# Patient Record
Sex: Male | Born: 1990 | Race: White | Hispanic: No | Marital: Married | Smoking: Never smoker
Health system: Southern US, Community
[De-identification: ages and names within clinical notes are randomized; demographics above are authoritative.]

## PROBLEM LIST (undated history)

## (undated) DIAGNOSIS — C6290 Malignant neoplasm of unspecified testis, unspecified whether descended or undescended: Secondary | ICD-10-CM

## (undated) HISTORY — DX: Malignant neoplasm of unspecified testis, unspecified whether descended or undescended: C62.90

---

## 2014-07-22 ENCOUNTER — Ambulatory Visit: Payer: Self-pay | Admitting: Oncology

## 2014-07-22 ENCOUNTER — Other Ambulatory Visit: Payer: Self-pay

## 2014-07-22 ENCOUNTER — Ambulatory Visit: Payer: Self-pay

## 2014-07-29 ENCOUNTER — Telehealth: Payer: Self-pay | Admitting: Oncology

## 2014-07-29 NOTE — Telephone Encounter (Signed)
new patient appt-s/w patient motehr and gave np appt for 07/05 @ 10:30 w/Dr. Alen Blew Referring-[facility]

## 2014-08-13 ENCOUNTER — Telehealth: Payer: Self-pay | Admitting: Oncology

## 2014-08-13 NOTE — Telephone Encounter (Signed)
np appt confirmed for 07/05 @ 10:30 w/Dr. Alen Blew

## 2014-08-17 ENCOUNTER — Encounter: Payer: Self-pay | Admitting: Oncology

## 2014-08-17 ENCOUNTER — Ambulatory Visit (HOSPITAL_BASED_OUTPATIENT_CLINIC_OR_DEPARTMENT_OTHER): Payer: Self-pay | Admitting: Oncology

## 2014-08-17 ENCOUNTER — Telehealth: Payer: Self-pay | Admitting: Oncology

## 2014-08-17 ENCOUNTER — Other Ambulatory Visit: Payer: Self-pay

## 2014-08-17 ENCOUNTER — Encounter (INDEPENDENT_AMBULATORY_CARE_PROVIDER_SITE_OTHER): Payer: Self-pay

## 2014-08-17 ENCOUNTER — Ambulatory Visit: Payer: Self-pay

## 2014-08-17 ENCOUNTER — Telehealth: Payer: Self-pay | Admitting: *Deleted

## 2014-08-17 VITALS — BP 118/75 | HR 63 | Temp 97.7°F | Resp 18 | Ht 68.0 in | Wt 127.9 lb

## 2014-08-17 DIAGNOSIS — Z720 Tobacco use: Secondary | ICD-10-CM

## 2014-08-17 DIAGNOSIS — C6290 Malignant neoplasm of unspecified testis, unspecified whether descended or undescended: Secondary | ICD-10-CM

## 2014-08-17 DIAGNOSIS — R918 Other nonspecific abnormal finding of lung field: Secondary | ICD-10-CM

## 2014-08-17 MED ORDER — PROCHLORPERAZINE MALEATE 10 MG PO TABS: 10.0000 mg | ORAL_TABLET | Freq: Four times a day (QID) | ORAL | Status: DC | PRN

## 2014-08-17 MED ORDER — ONDANSETRON HCL 8 MG PO TABS: 8.0000 mg | ORAL_TABLET | Freq: Three times a day (TID) | ORAL | Status: DC | PRN

## 2014-08-17 NOTE — Telephone Encounter (Signed)
Per staff message and POF I have scheduled appts. Advised scheduler of appts. JMW  

## 2014-08-17 NOTE — Progress Notes (Signed)
Checked in new pt with no insurance.  Pt applied for Medicaid in April.  His caseworker informed him they had to send his case to [facility] so he is awaiting approval.  I informed pt that since he's currently self-pay he will receive a 50 or 55% discount on today's visit and if and when his Medicaid becomes active, billing will submit claims to them for reimbursement.  Pt has my card for any billing questions or concerns.

## 2014-08-17 NOTE — Progress Notes (Signed)
Please see consult note.  

## 2014-08-17 NOTE — Telephone Encounter (Signed)
Gave and pirnted appt sched and avs fo rpt for July Aug and SEpt...lvm for PFT to call back to sched appts....gv pt barium

## 2014-08-17 NOTE — Consult Note (Signed)
Reason for Referral: Testicular cancer.   HPI: Kelly Garza is a pleasant 24 year old gentleman with a history of smoking and possible reactive airway disease. 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 He subsequently developed enlargement of his right testicle while he was at work. He was initially thought to be related to a hernia but subsequent evaluation revealed a possible testicular cancer. He was evaluated by urology at [facility] and underwent a orchiectomy on 04/19/2014. The pathology revealed an 8.0 x 6.5 x 6.1 cm tumor with 65% embryonal component. 25% teratoma and 10% yolk sac. The margins were not involved. The pathological staging was T2 NX. His tumor markers were elevated with alpha-fetoprotein was initially at 2038 and declined to 3.48 on 06/18/2014. His beta-hCG was 942 and declined to a nadir of 18 and most recently was up to 42 on 06/18/2014. Staging workup didn't reveal pulmonary nodules suggesting the possibility of metastatic disease although not confirmed. Patient was evaluated by Dr. Marcello Moores at [facility] medical oncology and recommended systemic chemotherapy. Patient opted to have this done in [place] to be closer to home. Clinically, he is completely recovered at this time and have not have any residual symptoms. He has quit smoking and his breathing is normal.  He does not report any headaches, blurry vision, syncope or seizures. He does not report any fevers, chills, sweats or weight loss. He does not report any chest pain, palpitation, orthopnea or PND. He does not report any cough, wheezing, hemoptysis, shortness of breath, dyspnea on exertion or difficulty breathing. He does not report any nausea, vomiting, abdominal pain, constipation, diarrhea, hematochezia or change in his bowel  habits. He does not report any frequency, urgency, hesitancy or hematuria. He does not report any skeletal complaints including arthralgias myalgias. He does not report any total cold intolerance. Does not report any mood disorder clearing anxiety or depression. Remaining review of systems unremarkable.   Past Medical History  Diagnosis Date  . Testis cancer   :  No past surgical history on file.:   Current outpatient prescriptions:  .  albuterol (PROVENTIL HFA;VENTOLIN HFA) 108 (90 BASE) MCG/ACT inhaler, Inhale into the lungs every 6 (six) hours as needed for wheezing or shortness of breath., Disp: , Rfl:  .  ibuprofen (ADVIL,MOTRIN) 200 MG tablet, Take 200 mg by mouth every 6 (six) hours as needed., Disp: , Rfl:  .  nicotine (NICODERM CQ - DOSED IN MG/24 HR) 7 mg/24hr patch, Place 7 mg onto the skin daily., Disp: , Rfl:  .  testosterone enanthate (DELATESTRYL) 200 MG/ML injection, Inject 200 mg into the muscle every 14 (fourteen) days. For IM use only, Disp: , Rfl:  .  ondansetron (ZOFRAN) 8 MG tablet, Take 1 tablet (8 mg total) by mouth every 8 (eight) hours as needed for nausea or vomiting., Disp: 20 tablet, Rfl: 0 .  prochlorperazine (COMPAZINE) 10 MG tablet, Take 1 tablet (10 mg total) by mouth every 6 (six) hours as needed for nausea or vomiting., Disp: 30 tablet, Rfl: 0:  No Known Allergies:  No family history on file.:  History   Social History  . Marital Status: Single    Spouse Name: N/A  . Number of Children: N/A  . Years of Education: N/A   Occupational History  . Not on file.   Social  History Main Topics  . Smoking status: Not on file  . Smokeless tobacco: Not on file  . Alcohol Use: Not on file  . Drug Use: Not on file  . Sexual Activity: Not on file   Other Topics Concern  . Not on file   Social History Narrative  . No narrative on file  :  Pertinent items are noted in HPI.  Exam: ECOG 0 Blood pressure 118/75, pulse 63, temperature 97.7 F (36.5 C),  temperature source Oral, resp. rate 18, height 5\' 8"  (1.727 m), weight 127 lb 14.4 oz (58.015 kg), SpO2 100 %. General appearance: alert and cooperative Head: Normocephalic, without obvious abnormality Nose: Nares normal. Septum midline. Mucosa normal. No drainage or sinus tenderness. Throat: lips, mucosa, and tongue normal; teeth and gums normal Neck: no adenopathy Back: negative Resp: clear to auscultation bilaterally Chest wall: no tenderness Cardio: regular rate and rhythm, S1, S2 normal, no murmur, click, rub or gallop GI: soft, non-tender; bowel sounds normal; no masses,  no organomegaly Extremities: extremities normal, atraumatic, no cyanosis or edema Pulses: 2+ and symmetric Skin: Skin color, texture, turgor normal. No rashes or lesions Lymph nodes: Cervical, supraclavicular, and axillary nodes normal. Neurologic: Grossly normal   His laboratory testing obtained at [facility] were reviewed personally today. CBC showed a hemoglobin of 15.1. Chemistries showed normal liver function test and normal kidney function tests. He had a normal LDH. Tumor markers noted in the history of present illness.   Assessment and Plan:  24 year old gentleman with the following issues:  1. 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 The pathology showed a focal LVI . His tumor markers were elevated with alpha-fetoprotein of 2038 and a beta-hCG of 942. Alpha-fetoprotein normalized postoperatively and his beta-hCG after a nadir of 18 was climbing up to 26. Imaging studies suggested pulmonary nodules that could be suspicious for malignancy. His staging including stage IS versus stage IIIa.  The natural course of this disease was discussed with the patient and his family today. I do agree that he will require  systemic chemotherapy regardless of the nature of his pulmonary nodules. Given the rise in his tumor marker and his high risk disease is very reasonable to assume that he has metastatic disease.  The logistics of administration of BEP chemotherapy was discussed. Complications include nausea, vomiting, myelosuppression, neutropenia, neutropenic sepsis, infusion related complications, neuropathy, renal toxicity were reviewed. Rare complications such as secondary malignancies very important and were reviewed today especially in such a young patient.  The, case associated with bleomycin were specially reviewed. These would include pulmonary toxicity but could be severe and rarely can result in death. I am inclined to limit for bleomycin if he has any abnormalities in his pulmonary function test. He has quit smoking at this time his pulmonary status is stable.  I plan to proceed with 3 cycles of BEP all 4 cycles of BEP if there is any contraindication from a pulmonary standpoint for bleomycin. Chemotherapy education class will be set up for him as well. I will restage him with CT scan and repeat tumor markers before the start of chemotherapy.  The goal of systemic chemotherapy is curative at this time.  2. Pulmonary toxicity prophylaxis: I will obtain pulmonary function tests before the first cycle and before each cycle and  will forward. I continue to instruct him to abstain from smoking and report any respiratory symptoms he develops at any time.  3. Neutropenia prophylaxis: He might require growth factor support depending on his blood counts moving forward.  4. IV access: Risks and benefits of Port-A-Cath insertion were discussed today. These complications would include thrombosis, bleeding or infection. He elected to proceed with his peripheral veins for the time being but that could be an option in the future.  5. Antiemetics: Prescription for Zofran and Compazine was given to the patient today and we  can adjust his antiemetics regimen if needed to further.  6. Fertility issues: He has underwent sperm banking prior to his orchiectomy in March 2016.  7. Follow-up: Will be upon start of his systemic chemotherapy tentatively around 08/30/2014.  All questions were answered today to his satisfaction.

## 2014-08-19 ENCOUNTER — Other Ambulatory Visit (HOSPITAL_COMMUNITY): Payer: Self-pay | Admitting: Respiratory Therapy

## 2014-08-24 ENCOUNTER — Encounter: Payer: Self-pay | Admitting: *Deleted

## 2014-08-24 ENCOUNTER — Other Ambulatory Visit (HOSPITAL_BASED_OUTPATIENT_CLINIC_OR_DEPARTMENT_OTHER): Payer: Self-pay

## 2014-08-24 ENCOUNTER — Other Ambulatory Visit: Payer: Self-pay

## 2014-08-24 DIAGNOSIS — C6290 Malignant neoplasm of unspecified testis, unspecified whether descended or undescended: Secondary | ICD-10-CM

## 2014-08-24 LAB — CBC WITH DIFFERENTIAL/PLATELET
BASO%: 1.3 % (ref 0.0–2.0)
Basophils Absolute: 0.1 10*3/uL (ref 0.0–0.1)
EOS%: 3.9 % (ref 0.0–7.0)
Eosinophils Absolute: 0.2 10*3/uL (ref 0.0–0.5)
HCT: 48.9 % (ref 38.4–49.9)
HEMOGLOBIN: 16.5 g/dL (ref 13.0–17.1)
LYMPH%: 20.3 % (ref 14.0–49.0)
MCH: 31.6 pg (ref 27.2–33.4)
MCHC: 33.7 g/dL (ref 32.0–36.0)
MCV: 93.8 fL (ref 79.3–98.0)
MONO#: 0.4 10*3/uL (ref 0.1–0.9)
MONO%: 7.4 % (ref 0.0–14.0)
NEUT%: 67.1 % (ref 39.0–75.0)
NEUTROS ABS: 3.7 10*3/uL (ref 1.5–6.5)
Platelets: 275 10*3/uL (ref 140–400)
RBC: 5.21 10*6/uL (ref 4.20–5.82)
RDW: 13.7 % (ref 11.0–14.6)
WBC: 5.5 10*3/uL (ref 4.0–10.3)
lymph#: 1.1 10*3/uL (ref 0.9–3.3)

## 2014-08-24 LAB — COMPREHENSIVE METABOLIC PANEL (CC13)
ALBUMIN: 4.3 g/dL (ref 3.5–5.0)
ALK PHOS: 80 U/L (ref 40–150)
ALT: 32 U/L (ref 0–55)
ANION GAP: 7 meq/L (ref 3–11)
AST: 28 U/L (ref 5–34)
BILIRUBIN TOTAL: 0.31 mg/dL (ref 0.20–1.20)
BUN: 10.1 mg/dL (ref 7.0–26.0)
CALCIUM: 9.7 mg/dL (ref 8.4–10.4)
CHLORIDE: 106 meq/L (ref 98–109)
CO2: 28 meq/L (ref 22–29)
CREATININE: 1 mg/dL (ref 0.7–1.3)
EGFR: >90 mL/min/{1.73_m2} (ref >90)
Glucose: 98 mg/dl (ref 70–140)
Potassium: 5 mEq/L (ref 3.5–5.1)
SODIUM: 141 meq/L (ref 136–145)
TOTAL PROTEIN: 7.6 g/dL (ref 6.4–8.3)

## 2014-08-24 LAB — LACTATE DEHYDROGENASE (CC13): LDH: 179 U/L (ref 125–245)

## 2014-08-25 ENCOUNTER — Ambulatory Visit (HOSPITAL_COMMUNITY)
Admission: RE | Admit: 2014-08-25 | Discharge: 2014-08-25 | Disposition: A | Discharge status: Home / Self Care | Payer: Self-pay | Source: Ambulatory Visit | Attending: Oncology | Admitting: Oncology

## 2014-08-25 ENCOUNTER — Encounter (HOSPITAL_COMMUNITY): Payer: Self-pay

## 2014-08-25 ENCOUNTER — Encounter: Payer: Self-pay | Admitting: *Deleted

## 2014-08-25 DIAGNOSIS — Z9079 Acquired absence of other genital organ(s): Secondary | ICD-10-CM | POA: Insufficient documentation

## 2014-08-25 DIAGNOSIS — R911 Solitary pulmonary nodule: Secondary | ICD-10-CM | POA: Insufficient documentation

## 2014-08-25 DIAGNOSIS — C6290 Malignant neoplasm of unspecified testis, unspecified whether descended or undescended: Secondary | ICD-10-CM | POA: Insufficient documentation

## 2014-08-25 DIAGNOSIS — J439 Emphysema, unspecified: Secondary | ICD-10-CM | POA: Insufficient documentation

## 2014-08-25 DIAGNOSIS — R599 Enlarged lymph nodes, unspecified: Secondary | ICD-10-CM | POA: Insufficient documentation

## 2014-08-25 DIAGNOSIS — Z08 Encounter for follow-up examination after completed treatment for malignant neoplasm: Secondary | ICD-10-CM | POA: Insufficient documentation

## 2014-08-25 MED ORDER — IOHEXOL 300 MG/ML  SOLN
100.0000 mL | Freq: Once | INTRAMUSCULAR | Status: AC | PRN
  Administered 2014-08-25: 100 mL via INTRAVENOUS

## 2014-08-25 NOTE — Progress Notes (Signed)
Spoke with tiffany in medical records, she called [facility] and they have not done a PFT on patient since 1998. Will notify dr Alen Blew.

## 2014-08-25 NOTE — Progress Notes (Signed)
Discussed with Dr. Hazeline Junker nurse about if a PFT had been done on patient.  Maybe using one from [facility]? Patient had stated on 7/12 that thought using one done at [facility].  Dixie will follow up.

## 2014-08-26 ENCOUNTER — Other Ambulatory Visit (HOSPITAL_COMMUNITY): Payer: Self-pay | Admitting: Respiratory Therapy

## 2014-08-26 ENCOUNTER — Other Ambulatory Visit: Payer: Self-pay | Admitting: *Deleted

## 2014-08-26 ENCOUNTER — Other Ambulatory Visit: Payer: Self-pay | Admitting: Hematology and Oncology

## 2014-08-26 ENCOUNTER — Telehealth: Payer: Self-pay | Admitting: *Deleted

## 2014-08-26 ENCOUNTER — Encounter: Payer: Self-pay | Admitting: *Deleted

## 2014-08-26 DIAGNOSIS — C6290 Malignant neoplasm of unspecified testis, unspecified whether descended or undescended: Secondary | ICD-10-CM

## 2014-08-26 LAB — PULMONARY FUNCTION TEST
DL/VA % PRED: 105 %
DL/VA: 4.84 ml/min/mmHg/L
DLCO cor % pred: 91 %
DLCO cor: 27.24 ml/min/mmHg
DLCO unc % pred: 96 %
DLCO unc: 28.59 ml/min/mmHg
FEF 25-75 Post: 3.41 L/sec
FEF 25-75 Pre: 1.97 L/sec
FEF2575-%Change-Post: 73 %
FEF2575-%PRED-POST: 73 %
FEF2575-%PRED-PRE: 42 %
FEV1-%Change-Post: 17 %
FEV1-%PRED-POST: 81 %
FEV1-%PRED-PRE: 68 %
FEV1-Post: 3.55 L
FEV1-Pre: 3.01 L
FEV1FVC-%Change-Post: 12 %
FEV1FVC-%Pred-Pre: 82 %
FEV6-%CHANGE-POST: 4 %
FEV6-%PRED-POST: 87 %
FEV6-%Pred-Pre: 83 %
FEV6-PRE: 4.37 L
FEV6-Post: 4.59 L
FEV6FVC-%Change-Post: 0 %
FEV6FVC-%PRED-POST: 100 %
FEV6FVC-%Pred-Pre: 99 %
FVC-%CHANGE-POST: 4 %
FVC-%PRED-PRE: 83 %
FVC-%Pred-Post: 87 %
FVC-POST: 4.59 L
FVC-PRE: 4.39 L
POST FEV1/FVC RATIO: 77 %
PRE FEV6/FVC RATIO: 100 %
Post FEV6/FVC ratio: 100 %
Pre FEV1/FVC ratio: 68 %
RV % pred: 132 %
RV: 1.81 L
TLC % PRED: 94 %
TLC: 6.13 L

## 2014-08-26 NOTE — Telephone Encounter (Signed)
Spoke with patient, gave appt date and time for tomorrow @ [facility]. 10:45 am. No smoking, no caffeine, and no breathing treatments before appt. Patient verbalized understanding and gave # of PFT associate tara [phone], should he have any questions.

## 2014-08-26 NOTE — Progress Notes (Signed)
Called patient, explained the need for a PFT and that schedulers would be calling him to have this done tomorrow (08/27/14). Patient verbalized understanding. Instructed patient to call me back this afternoon, if he has not received a call from scheduling. Note to dr Hazeline Junker desk

## 2014-08-27 ENCOUNTER — Ambulatory Visit (HOSPITAL_COMMUNITY)
Admission: RE | Admit: 2014-08-27 | Discharge: 2014-08-27 | Disposition: A | Discharge status: Home / Self Care | Payer: Self-pay | Source: Ambulatory Visit | Attending: Hematology and Oncology | Admitting: Hematology and Oncology

## 2014-08-27 ENCOUNTER — Encounter (HOSPITAL_COMMUNITY): Payer: Self-pay

## 2014-08-27 ENCOUNTER — Inpatient Hospital Stay (HOSPITAL_COMMUNITY): Admission: RE | Admit: 2014-08-27 | Payer: Self-pay | Source: Ambulatory Visit

## 2014-08-27 DIAGNOSIS — C6290 Malignant neoplasm of unspecified testis, unspecified whether descended or undescended: Secondary | ICD-10-CM | POA: Insufficient documentation

## 2014-08-27 LAB — AFP TUMOR MARKER: AFP TUMOR MARKER: 2.8 ng/mL (ref <6.1)

## 2014-08-27 LAB — BETA HCG QUANT (REF LAB): BETA HCG, TUMOR MARKER: 412.4 m[IU]/mL — AB (ref <5.0)

## 2014-08-27 MED ORDER — ALBUTEROL SULFATE (2.5 MG/3ML) 0.083% IN NEBU
2.5000 mg | INHALATION_SOLUTION | Freq: Once | RESPIRATORY_TRACT | Status: AC
  Administered 2014-08-27: 2.5 mg via RESPIRATORY_TRACT

## 2014-08-30 ENCOUNTER — Ambulatory Visit (HOSPITAL_BASED_OUTPATIENT_CLINIC_OR_DEPARTMENT_OTHER): Payer: Self-pay

## 2014-08-30 ENCOUNTER — Other Ambulatory Visit (HOSPITAL_BASED_OUTPATIENT_CLINIC_OR_DEPARTMENT_OTHER): Payer: Self-pay

## 2014-08-30 VITALS — BP 123/75 | HR 86 | Temp 98.0°F | Resp 18

## 2014-08-30 DIAGNOSIS — C6290 Malignant neoplasm of unspecified testis, unspecified whether descended or undescended: Secondary | ICD-10-CM

## 2014-08-30 DIAGNOSIS — Z5111 Encounter for antineoplastic chemotherapy: Secondary | ICD-10-CM

## 2014-08-30 LAB — CBC WITH DIFFERENTIAL/PLATELET
BASO%: 2.2 % — ABNORMAL HIGH (ref 0.0–2.0)
Basophils Absolute: 0.1 10*3/uL (ref 0.0–0.1)
EOS ABS: 0.2 10*3/uL (ref 0.0–0.5)
EOS%: 3.4 % (ref 0.0–7.0)
HCT: 45.6 % (ref 38.4–49.9)
HEMOGLOBIN: 15.4 g/dL (ref 13.0–17.1)
LYMPH%: 22.1 % (ref 14.0–49.0)
MCH: 31.5 pg (ref 27.2–33.4)
MCHC: 33.8 g/dL (ref 32.0–36.0)
MCV: 93 fL (ref 79.3–98.0)
MONO#: 0.5 10*3/uL (ref 0.1–0.9)
MONO%: 8.2 % (ref 0.0–14.0)
NEUT#: 4.1 10*3/uL (ref 1.5–6.5)
NEUT%: 64.1 % (ref 39.0–75.0)
PLATELETS: 266 10*3/uL (ref 140–400)
RBC: 4.9 10*6/uL (ref 4.20–5.82)
RDW: 13.4 % (ref 11.0–14.6)
WBC: 6.4 10*3/uL (ref 4.0–10.3)
lymph#: 1.4 10*3/uL (ref 0.9–3.3)

## 2014-08-30 LAB — COMPREHENSIVE METABOLIC PANEL (CC13)
ALBUMIN: 3.9 g/dL (ref 3.5–5.0)
ALT: 55 U/L (ref 0–55)
ANION GAP: 7 meq/L (ref 3–11)
AST: 33 U/L (ref 5–34)
Alkaline Phosphatase: 75 U/L (ref 40–150)
BUN: 13.6 mg/dL (ref 7.0–26.0)
CHLORIDE: 108 meq/L (ref 98–109)
CO2: 24 meq/L (ref 22–29)
CREATININE: 0.8 mg/dL (ref 0.7–1.3)
Calcium: 9.2 mg/dL (ref 8.4–10.4)
Glucose: 112 mg/dl (ref 70–140)
Potassium: 3.9 mEq/L (ref 3.5–5.1)
SODIUM: 139 meq/L (ref 136–145)
Total Bilirubin: 0.22 mg/dL (ref 0.20–1.20)
Total Protein: 7 g/dL (ref 6.4–8.3)

## 2014-08-30 MED ORDER — SODIUM CHLORIDE 0.9 % IV SOLN
20.0000 mg/m2 | Freq: Once | INTRAVENOUS | Status: AC
  Administered 2014-08-30: 33 mg via INTRAVENOUS
  Filled 2014-08-30: qty 33

## 2014-08-30 MED ORDER — SODIUM CHLORIDE 0.9 % IV SOLN
Freq: Once | INTRAVENOUS | Status: AC
  Administered 2014-08-30: 12:00:00 via INTRAVENOUS
  Filled 2014-08-30: qty 5

## 2014-08-30 MED ORDER — SODIUM CHLORIDE 0.9 % IV SOLN
100.0000 mg/m2 | Freq: Once | INTRAVENOUS | Status: AC
  Administered 2014-08-30: 170 mg via INTRAVENOUS
  Filled 2014-08-30: qty 8.5

## 2014-08-30 MED ORDER — SODIUM CHLORIDE 0.9 % IV SOLN
Freq: Once | INTRAVENOUS | Status: AC
  Administered 2014-08-30: 10:00:00 via INTRAVENOUS

## 2014-08-30 MED ORDER — PALONOSETRON HCL INJECTION 0.25 MG/5ML
INTRAVENOUS | Status: AC
  Filled 2014-08-30: qty 5

## 2014-08-30 MED ORDER — POTASSIUM CHLORIDE 2 MEQ/ML IV SOLN
Freq: Once | INTRAVENOUS | Status: AC
  Administered 2014-08-30: 10:00:00 via INTRAVENOUS
  Filled 2014-08-30: qty 10

## 2014-08-30 MED ORDER — PALONOSETRON HCL INJECTION 0.25 MG/5ML
0.2500 mg | Freq: Once | INTRAVENOUS | Status: AC
  Administered 2014-08-30: 0.25 mg via INTRAVENOUS

## 2014-08-30 NOTE — Progress Notes (Signed)
Total output 1600cc

## 2014-08-30 NOTE — Patient Instructions (Addendum)
[facility] Discharge Instructions for Patients Receiving Chemotherapy  Today you received the following chemotherapy agents Cisplatin   To help prevent nausea and vomiting after your treatment, we encourage you to take your nausea medication as directed   If you develop nausea and vomiting that is not controlled by your nausea medication, call the clinic.   BELOW ARE SYMPTOMS THAT SHOULD BE REPORTED IMMEDIATELY:  *FEVER GREATER THAN 100.5 F  *CHILLS WITH OR WITHOUT FEVER  NAUSEA AND VOMITING THAT IS NOT CONTROLLED WITH YOUR NAUSEA MEDICATION  *UNUSUAL SHORTNESS OF BREATH  *UNUSUAL BRUISING OR BLEEDING  TENDERNESS IN MOUTH AND THROAT WITH OR WITHOUT PRESENCE OF ULCERS  *URINARY PROBLEMS  *BOWEL PROBLEMS  UNUSUAL RASH Items with * indicate a potential emergency and should be followed up as soon as possible.  Feel free to call the clinic you have any questions or concerns. The clinic phone number is [phone].  Please show the [place] at check-in to the Emergency Department and triage nurse.   Etoposide, VP-16 injection What is this medicine? ETOPOSIDE, VP-16 (e toe POE side) is a chemotherapy drug. It is used to treat testicular cancer, lung cancer, and other cancers. This medicine may be used for other purposes; ask your health care provider or pharmacist if you have questions. COMMON BRAND NAME(S): Etopophos, Toposar, VePesid What should I tell my health care provider before I take this medicine? They need to know if you have any of these conditions: -infection -kidney disease -low blood counts, like low white cell, platelet, or red cell counts -an unusual or allergic reaction to etoposide, other chemotherapeutic agents, other medicines, foods, dyes, or preservatives -pregnant or trying to get pregnant -breast-feeding How should I use this medicine? This medicine is for infusion into a vein. It is administered in a hospital or clinic by  a specially trained health care professional. Talk to your pediatrician regarding the use of this medicine in children. Special care may be needed. Overdosage: If you think you have taken too much of this medicine contact a poison control center or emergency room at once. NOTE: This medicine is only for you. Do not share this medicine with others. What if I miss a dose? It is important not to miss your dose. Call your doctor or health care professional if you are unable to keep an appointment. What may interact with this medicine? -cyclosporine -medicines to increase blood counts like filgrastim, pegfilgrastim, sargramostim -vaccines This list may not describe all possible interactions. Give your health care provider a list of all the medicines, herbs, non-prescription drugs, or dietary supplements you use. Also tell them if you smoke, drink alcohol, or use illegal drugs. Some items may interact with your medicine. What should I watch for while using this medicine? Visit your doctor for checks on your progress. This drug may make you feel generally unwell. This is not uncommon, as chemotherapy can affect healthy cells as well as cancer cells. Report any side effects. Continue your course of treatment even though you feel ill unless your doctor tells you to stop. In some cases, you may be given additional medicines to help with side effects. Follow all directions for their use. Call your doctor or health care professional for advice if you get a fever, chills or sore throat, or other symptoms of a cold or flu. Do not treat yourself. This drug decreases your body's ability to fight infections. Try to avoid being around people who are sick. This medicine  may increase your risk to bruise or bleed. Call your doctor or health care professional if you notice any unusual bleeding. Be careful brushing and flossing your teeth or using a toothpick because you may get an infection or bleed more easily. If you  have any dental work done, tell your dentist you are receiving this medicine. Avoid taking products that contain aspirin, acetaminophen, ibuprofen, naproxen, or ketoprofen unless instructed by your doctor. These medicines may hide a fever. Do not become pregnant while taking this medicine. Women should inform their doctor if they wish to become pregnant or think they might be pregnant. There is a potential for serious side effects to an unborn child. Talk to your health care professional or pharmacist for more information. Do not breast-feed an infant while taking this medicine. What side effects may I notice from receiving this medicine? Side effects that you should report to your doctor or health care professional as soon as possible: -allergic reactions like skin rash, itching or hives, swelling of the face, lips, or tongue -low blood counts - this medicine may decrease the number of white blood cells, red blood cells and platelets. You may be at increased risk for infections and bleeding. -signs of infection - fever or chills, cough, sore throat, pain or difficulty passing urine -signs of decreased platelets or bleeding - bruising, pinpoint red spots on the skin, black, tarry stools, blood in the urine -signs of decreased red blood cells - unusually weak or tired, fainting spells, lightheadedness -breathing problems -changes in vision -mouth or throat sores or ulcers -pain, redness, swelling or irritation at the injection site -pain, tingling, numbness in the hands or feet -redness, blistering, peeling or loosening of the skin, including inside the mouth -seizures -vomiting Side effects that usually do not require medical attention (report to your doctor or health care professional if they continue or are bothersome): -diarrhea -hair loss -loss of appetite -nausea -stomach pain This list may not describe all possible side effects. Call your doctor for medical advice about side effects.  You may report side effects to FDA at 1-800-FDA-1088. Where should I keep my medicine? This drug is given in a hospital or clinic and will not be stored at home. NOTE: This sheet is a summary. It may not cover all possible information. If you have questions about this medicine, talk to your doctor, pharmacist, or health care provider.  2015, Elsevier/Gold Standard. (2007-06-02 17:24:12)   Cisplatin injection What is this medicine? CISPLATIN (SIS pla tin) is a chemotherapy drug. It targets fast dividing cells, like cancer cells, and causes these cells to die. This medicine is used to treat many types of cancer like bladder, ovarian, and testicular cancers. This medicine may be used for other purposes; ask your health care provider or pharmacist if you have questions. COMMON BRAND NAME(S): Platinol, Platinol -AQ What should I tell my health care provider before I take this medicine? They need to know if you have any of these conditions: -blood disorders -hearing problems -kidney disease -recent or ongoing radiation therapy -an unusual or allergic reaction to cisplatin, carboplatin, other chemotherapy, other medicines, foods, dyes, or preservatives -pregnant or trying to get pregnant -breast-feeding How should I use this medicine? This drug is given as an infusion into a vein. It is administered in a hospital or clinic by a specially trained health care professional. Talk to your pediatrician regarding the use of this medicine in children. Special care may be needed. Overdosage: If you think you have  taken too much of this medicine contact a poison control center or emergency room at once. NOTE: This medicine is only for you. Do not share this medicine with others. What if I miss a dose? It is important not to miss a dose. Call your doctor or health care professional if you are unable to keep an appointment. What may interact with this medicine? -dofetilide -foscarnet -medicines for  seizures -medicines to increase blood counts like filgrastim, pegfilgrastim, sargramostim -probenecid -pyridoxine used with altretamine -rituximab -some antibiotics like amikacin, gentamicin, neomycin, polymyxin B, streptomycin, tobramycin -sulfinpyrazone -vaccines -zalcitabine Talk to your doctor or health care professional before taking any of these medicines: -acetaminophen -aspirin -ibuprofen -ketoprofen -naproxen This list may not describe all possible interactions. Give your health care provider a list of all the medicines, herbs, non-prescription drugs, or dietary supplements you use. Also tell them if you smoke, drink alcohol, or use illegal drugs. Some items may interact with your medicine. What should I watch for while using this medicine? Your condition will be monitored carefully while you are receiving this medicine. You will need important blood work done while you are taking this medicine. This drug may make you feel generally unwell. This is not uncommon, as chemotherapy can affect healthy cells as well as cancer cells. Report any side effects. Continue your course of treatment even though you feel ill unless your doctor tells you to stop. In some cases, you may be given additional medicines to help with side effects. Follow all directions for their use. Call your doctor or health care professional for advice if you get a fever, chills or sore throat, or other symptoms of a cold or flu. Do not treat yourself. This drug decreases your body's ability to fight infections. Try to avoid being around people who are sick. This medicine may increase your risk to bruise or bleed. Call your doctor or health care professional if you notice any unusual bleeding. Be careful brushing and flossing your teeth or using a toothpick because you may get an infection or bleed more easily. If you have any dental work done, tell your dentist you are receiving this medicine. Avoid taking products that  contain aspirin, acetaminophen, ibuprofen, naproxen, or ketoprofen unless instructed by your doctor. These medicines may hide a fever. Do not become pregnant while taking this medicine. Women should inform their doctor if they wish to become pregnant or think they might be pregnant. There is a potential for serious side effects to an unborn child. Talk to your health care professional or pharmacist for more information. Do not breast-feed an infant while taking this medicine. Drink fluids as directed while you are taking this medicine. This will help protect your kidneys. Call your doctor or health care professional if you get diarrhea. Do not treat yourself. What side effects may I notice from receiving this medicine? Side effects that you should report to your doctor or health care professional as soon as possible: -allergic reactions like skin rash, itching or hives, swelling of the face, lips, or tongue -signs of infection - fever or chills, cough, sore throat, pain or difficulty passing urine -signs of decreased platelets or bleeding - bruising, pinpoint red spots on the skin, black, tarry stools, nosebleeds -signs of decreased red blood cells - unusually weak or tired, fainting spells, lightheadedness -breathing problems -changes in hearing -gout pain -low blood counts - This drug may decrease the number of white blood cells, red blood cells and platelets. You may be at increased  risk for infections and bleeding. -nausea and vomiting -pain, swelling, redness or irritation at the injection site -pain, tingling, numbness in the hands or feet -problems with balance, movement -trouble passing urine or change in the amount of urine Side effects that usually do not require medical attention (report to your doctor or health care professional if they continue or are bothersome): -changes in vision -loss of appetite -metallic taste in the mouth or changes in taste This list may not describe all  possible side effects. Call your doctor for medical advice about side effects. You may report side effects to FDA at 1-800-FDA-1088. Where should I keep my medicine? This drug is given in a hospital or clinic and will not be stored at home. NOTE: This sheet is a summary. It may not cover all possible information. If you have questions about this medicine, talk to your doctor, pharmacist, or health care provider.  2015, Elsevier/Gold Standard. (2007-05-06 14:40:54)

## 2014-08-31 ENCOUNTER — Ambulatory Visit (HOSPITAL_BASED_OUTPATIENT_CLINIC_OR_DEPARTMENT_OTHER): Payer: Self-pay

## 2014-08-31 VITALS — BP 117/76 | HR 88 | Temp 97.0°F | Resp 16

## 2014-08-31 DIAGNOSIS — Z5111 Encounter for antineoplastic chemotherapy: Secondary | ICD-10-CM

## 2014-08-31 DIAGNOSIS — C6290 Malignant neoplasm of unspecified testis, unspecified whether descended or undescended: Secondary | ICD-10-CM

## 2014-08-31 MED ORDER — SODIUM CHLORIDE 0.9 % IV SOLN
20.0000 mg/m2 | Freq: Once | INTRAVENOUS | Status: AC
  Administered 2014-08-31: 33 mg via INTRAVENOUS
  Filled 2014-08-31: qty 33

## 2014-08-31 MED ORDER — ETOPOSIDE CHEMO INJECTION 1 GM/50ML
100.0000 mg/m2 | Freq: Once | INTRAVENOUS | Status: AC
  Administered 2014-08-31: 170 mg via INTRAVENOUS
  Filled 2014-08-31: qty 8.5

## 2014-08-31 MED ORDER — POTASSIUM CHLORIDE 2 MEQ/ML IV SOLN
Freq: Once | INTRAVENOUS | Status: AC
  Administered 2014-08-31: 09:00:00 via INTRAVENOUS
  Filled 2014-08-31: qty 10

## 2014-08-31 MED ORDER — SODIUM CHLORIDE 0.9 % IV SOLN
30.0000 [IU] | Freq: Once | INTRAVENOUS | Status: AC
  Administered 2014-08-31: 30 [IU] via INTRAVENOUS
  Filled 2014-08-31: qty 10

## 2014-08-31 MED ORDER — SODIUM CHLORIDE 0.9 % IV SOLN
20.0000 mg | Freq: Once | INTRAVENOUS | Status: AC
  Administered 2014-08-31: 20 mg via INTRAVENOUS
  Filled 2014-08-31: qty 2

## 2014-08-31 MED ORDER — SODIUM CHLORIDE 0.9 % IV SOLN
Freq: Once | INTRAVENOUS | Status: AC
  Administered 2014-08-31: 09:00:00 via INTRAVENOUS

## 2014-08-31 NOTE — Patient Instructions (Signed)
Bleomycin injection What is this medicine? BLEOMYCIN (blee oh MYE sin) is a chemotherapy drug. It is used to treat many kinds of cancer like lymphoma, cervical cancer, head and neck cancer, and testicular cancer. It is also used to prevent and to treat fluid build-up around the lungs caused by some cancers. This medicine may be used for other purposes; ask your health care provider or pharmacist if you have questions. COMMON BRAND NAME(S): Blenoxane What should I tell my health care provider before I take this medicine? They need to know if you have any of these conditions: -cigarette smoker -kidney disease -lung disease -recent or ongoing radiation therapy -an unusual or allergic reaction to bleomycin, other chemotherapy agents, other medicines, foods, dyes, or preservatives -pregnant or trying to get pregnant -breast-feeding How should I use this medicine? This drug is given as an infusion into a vein or a body cavity. It can also be given as an injection into a muscle or under the skin. It is administered in a hospital or clinic by a specially trained health care professional. Talk to your pediatrician regarding the use of this medicine in children. Special care may be needed. Overdosage: If you think you have taken too much of this medicine contact a poison control center or emergency room at once. NOTE: This medicine is only for you. Do not share this medicine with others. What if I miss a dose? It is important not to miss your dose. Call your doctor or health care professional if you are unable to keep an appointment. What may interact with this medicine? -certain antibiotics given by injection -cisplatin -cyclosporine -diuretics -foscarnet -medicines to increase blood counts like filgrastim, pegfilgrastim, sargramostim -vaccines This list may not describe all possible interactions. Give your health care provider a list of all the medicines, herbs, non-prescription drugs, or  dietary supplements you use. Also tell them if you smoke, drink alcohol, or use illegal drugs. Some items may interact with your medicine. What should I watch for while using this medicine? Visit your doctor for checks on your progress. This drug may make you feel generally unwell. This is not uncommon, as chemotherapy can affect healthy cells as well as cancer cells. Report any side effects. Continue your course of treatment even though you feel ill unless your doctor tells you to stop. Call your doctor or health care professional for advice if you get a fever, chills or sore throat, or other symptoms of a cold or flu. Do not treat yourself. This drug decreases your body's ability to fight infections. Try to avoid being around people who are sick. Avoid taking products that contain aspirin, acetaminophen, ibuprofen, naproxen, or ketoprofen unless instructed by your doctor. These medicines may hide a fever. Do not become pregnant while taking this medicine. Women should inform their doctor if they wish to become pregnant or think they might be pregnant. There is a potential for serious side effects to an unborn child. Talk to your health care professional or pharmacist for more information. Do not breast-feed an infant while taking this medicine. There is a maximum amount of this medicine you should receive throughout your life. The amount depends on the medical condition being treated and your overall health. Your doctor will watch how much of this medicine you receive in your lifetime. Tell your doctor if you have taken this medicine before. What side effects may I notice from receiving this medicine? Side effects that you should report to your doctor or health care professional  as soon as possible: -allergic reactions like skin rash, itching or hives, swelling of the face, lips, or tongue -breathing problems -chest pain -confusion -cough -fast, irregular heartbeat -feeling faint or lightheaded,  falls -fever or chills -mouth sores -pain, tingling, numbness in the hands or feet -trouble passing urine or change in the amount of urine -yellowing of the eyes or skin Side effects that usually do not require medical attention (report to your doctor or health care professional if they continue or are bothersome): -darker skin color -hair loss -irritation at site where injected -loss of appetite -nail changes -nausea and vomiting -weight loss This list may not describe all possible side effects. Call your doctor for medical advice about side effects. You may report side effects to FDA at 1-800-FDA-1088. Where should I keep my medicine? This drug is given in a hospital or clinic and will not be stored at home. NOTE: This sheet is a summary. It may not cover all possible information. If you have questions about this medicine, talk to your doctor, pharmacist, or health care provider.  2015, [employer]. (2012-05-27 09:36:48)   [facility] Discharge Instructions for Patients Receiving Chemotherapy  Today you received the following chemotherapy agents Bleomycin/Cisplatin/Etoposide.  To help prevent nausea and vomiting after your treatment, we encourage you to take your nausea medication as directed.   If you develop nausea and vomiting that is not controlled by your nausea medication, call the clinic.   BELOW ARE SYMPTOMS THAT SHOULD BE REPORTED IMMEDIATELY:  *FEVER GREATER THAN 100.5 F  *CHILLS WITH OR WITHOUT FEVER  NAUSEA AND VOMITING THAT IS NOT CONTROLLED WITH YOUR NAUSEA MEDICATION  *UNUSUAL SHORTNESS OF BREATH  *UNUSUAL BRUISING OR BLEEDING  TENDERNESS IN MOUTH AND THROAT WITH OR WITHOUT PRESENCE OF ULCERS  *URINARY PROBLEMS  *BOWEL PROBLEMS  UNUSUAL RASH Items with * indicate a potential emergency and should be followed up as soon as possible.  Feel free to call the clinic you have any questions or concerns. The clinic phone number is  [phone].  Please show the [place] at check-in to the Emergency Department and triage nurse.

## 2014-08-31 NOTE — Progress Notes (Signed)
11:25 - Pt voided 1019mL prior to beginning of Cisplatin infusion.

## 2014-09-01 ENCOUNTER — Other Ambulatory Visit: Payer: Self-pay | Admitting: *Deleted

## 2014-09-01 ENCOUNTER — Ambulatory Visit (HOSPITAL_BASED_OUTPATIENT_CLINIC_OR_DEPARTMENT_OTHER): Payer: Self-pay

## 2014-09-01 VITALS — BP 117/55 | HR 88 | Temp 97.9°F | Resp 18

## 2014-09-01 DIAGNOSIS — C6290 Malignant neoplasm of unspecified testis, unspecified whether descended or undescended: Secondary | ICD-10-CM

## 2014-09-01 DIAGNOSIS — Z5111 Encounter for antineoplastic chemotherapy: Secondary | ICD-10-CM

## 2014-09-01 MED ORDER — POTASSIUM CHLORIDE 2 MEQ/ML IV SOLN
Freq: Once | INTRAVENOUS | Status: AC
  Administered 2014-09-01: 09:00:00 via INTRAVENOUS
  Filled 2014-09-01: qty 10

## 2014-09-01 MED ORDER — SODIUM CHLORIDE 0.9 % IV SOLN
20.0000 mg | Freq: Once | INTRAVENOUS | Status: AC
  Administered 2014-09-01: 20 mg via INTRAVENOUS
  Filled 2014-09-01: qty 2

## 2014-09-01 MED ORDER — SODIUM CHLORIDE 0.9 % IV SOLN
100.0000 mg/m2 | Freq: Once | INTRAVENOUS | Status: AC
  Administered 2014-09-01: 170 mg via INTRAVENOUS
  Filled 2014-09-01: qty 8.5

## 2014-09-01 MED ORDER — SODIUM CHLORIDE 0.9 % IV SOLN
Freq: Once | INTRAVENOUS | Status: AC
  Administered 2014-09-01: 09:00:00 via INTRAVENOUS

## 2014-09-01 MED ORDER — ALUM & MAG HYDROXIDE-SIMETH 200-200-20 MG/5ML PO SUSP
30.0000 mL | Freq: Once | ORAL | Status: AC
  Administered 2014-09-01: 30 mL via ORAL
  Filled 2014-09-01: qty 30

## 2014-09-01 MED ORDER — PALONOSETRON HCL INJECTION 0.25 MG/5ML
INTRAVENOUS | Status: AC
  Filled 2014-09-01: qty 5

## 2014-09-01 MED ORDER — LORAZEPAM 1 MG PO TABS: 1.0000 mg | ORAL_TABLET | Freq: Every evening | ORAL | Status: DC | PRN

## 2014-09-01 MED ORDER — CISPLATIN CHEMO INJECTION 100MG/100ML
20.0000 mg/m2 | Freq: Once | INTRAVENOUS | Status: AC
  Administered 2014-09-01: 33 mg via INTRAVENOUS
  Filled 2014-09-01: qty 33

## 2014-09-01 MED ORDER — PALONOSETRON HCL INJECTION 0.25 MG/5ML
0.2500 mg | Freq: Once | INTRAVENOUS | Status: AC
  Administered 2014-09-01: 0.25 mg via INTRAVENOUS

## 2014-09-01 MED ORDER — HYDROCODONE-ACETAMINOPHEN 5-325 MG PO TABS: 1.0000 | ORAL_TABLET | Freq: Four times a day (QID) | ORAL | Status: DC | PRN

## 2014-09-01 NOTE — Progress Notes (Signed)
Patient states he is having heartburn. Order given and carried out for Maalox.

## 2014-09-01 NOTE — Patient Instructions (Signed)
[facility] Discharge Instructions for Patients Receiving Chemotherapy  Today you received the following chemotherapy agents Cisplatin/VP 16 To help prevent nausea and vomiting after your treatment, we encourage you to take your nausea medication as prescribed.   If you develop nausea and vomiting that is not controlled by your nausea medication, call the clinic.   BELOW ARE SYMPTOMS THAT SHOULD BE REPORTED IMMEDIATELY:  *FEVER GREATER THAN 100.5 F  *CHILLS WITH OR WITHOUT FEVER  NAUSEA AND VOMITING THAT IS NOT CONTROLLED WITH YOUR NAUSEA MEDICATION  *UNUSUAL SHORTNESS OF BREATH  *UNUSUAL BRUISING OR BLEEDING  TENDERNESS IN MOUTH AND THROAT WITH OR WITHOUT PRESENCE OF ULCERS  *URINARY PROBLEMS  *BOWEL PROBLEMS  UNUSUAL RASH Items with * indicate a potential emergency and should be followed up as soon as possible.  Feel free to call the clinic you have any questions or concerns. The clinic phone number is [phone].  Please show the CHEMO ALERT CARD at check-in to the Emergency Department and triage nurse.   

## 2014-09-02 ENCOUNTER — Encounter: Payer: Self-pay | Admitting: Oncology

## 2014-09-02 ENCOUNTER — Encounter: Payer: Self-pay | Admitting: *Deleted

## 2014-09-02 ENCOUNTER — Ambulatory Visit (HOSPITAL_BASED_OUTPATIENT_CLINIC_OR_DEPARTMENT_OTHER): Payer: Self-pay

## 2014-09-02 VITALS — BP 128/64 | HR 81 | Temp 97.7°F | Resp 18

## 2014-09-02 DIAGNOSIS — C6290 Malignant neoplasm of unspecified testis, unspecified whether descended or undescended: Secondary | ICD-10-CM

## 2014-09-02 DIAGNOSIS — Z5111 Encounter for antineoplastic chemotherapy: Secondary | ICD-10-CM

## 2014-09-02 MED ORDER — POTASSIUM CHLORIDE 2 MEQ/ML IV SOLN
Freq: Once | INTRAVENOUS | Status: AC
  Administered 2014-09-02: 12:00:00 via INTRAVENOUS
  Filled 2014-09-02: qty 10

## 2014-09-02 MED ORDER — SODIUM CHLORIDE 0.9 % IV SOLN
100.0000 mg/m2 | Freq: Once | INTRAVENOUS | Status: AC
  Administered 2014-09-02: 170 mg via INTRAVENOUS
  Filled 2014-09-02: qty 8.5

## 2014-09-02 MED ORDER — SODIUM CHLORIDE 0.9 % IV SOLN
20.0000 mg | Freq: Once | INTRAVENOUS | Status: AC
  Administered 2014-09-02: 20 mg via INTRAVENOUS
  Filled 2014-09-02: qty 2

## 2014-09-02 MED ORDER — SODIUM CHLORIDE 0.9 % IV SOLN
20.0000 mg/m2 | Freq: Once | INTRAVENOUS | Status: AC
  Administered 2014-09-02: 33 mg via INTRAVENOUS
  Filled 2014-09-02: qty 33

## 2014-09-02 MED ORDER — SODIUM CHLORIDE 0.9 % IV SOLN
Freq: Once | INTRAVENOUS | Status: AC
  Administered 2014-09-02: 12:00:00 via INTRAVENOUS

## 2014-09-02 NOTE — Progress Notes (Signed)
Pt is approved for the $400 [facility] grant.  °

## 2014-09-02 NOTE — Patient Instructions (Signed)
[facility] Discharge Instructions for Patients Receiving Chemotherapy  Today you received the following chemotherapy agents Cisplatin/VP 16 To help prevent nausea and vomiting after your treatment, we encourage you to take your nausea medication as prescribed.   If you develop nausea and vomiting that is not controlled by your nausea medication, call the clinic.   BELOW ARE SYMPTOMS THAT SHOULD BE REPORTED IMMEDIATELY:  *FEVER GREATER THAN 100.5 F  *CHILLS WITH OR WITHOUT FEVER  NAUSEA AND VOMITING THAT IS NOT CONTROLLED WITH YOUR NAUSEA MEDICATION  *UNUSUAL SHORTNESS OF BREATH  *UNUSUAL BRUISING OR BLEEDING  TENDERNESS IN MOUTH AND THROAT WITH OR WITHOUT PRESENCE OF ULCERS  *URINARY PROBLEMS  *BOWEL PROBLEMS  UNUSUAL RASH Items with * indicate a potential emergency and should be followed up as soon as possible.  Feel free to call the clinic you have any questions or concerns. The clinic phone number is [phone].  Please show the CHEMO ALERT CARD at check-in to the Emergency Department and triage nurse.   

## 2014-09-03 ENCOUNTER — Ambulatory Visit (HOSPITAL_BASED_OUTPATIENT_CLINIC_OR_DEPARTMENT_OTHER): Payer: Self-pay

## 2014-09-03 ENCOUNTER — Encounter: Payer: Self-pay | Admitting: Oncology

## 2014-09-03 ENCOUNTER — Encounter: Payer: Self-pay | Admitting: *Deleted

## 2014-09-03 VITALS — BP 121/79 | HR 92 | Temp 97.9°F

## 2014-09-03 DIAGNOSIS — Z5111 Encounter for antineoplastic chemotherapy: Secondary | ICD-10-CM

## 2014-09-03 DIAGNOSIS — C6290 Malignant neoplasm of unspecified testis, unspecified whether descended or undescended: Secondary | ICD-10-CM

## 2014-09-03 MED ORDER — SODIUM CHLORIDE 0.9 % IV SOLN
100.0000 mg/m2 | Freq: Once | INTRAVENOUS | Status: AC
  Administered 2014-09-03: 170 mg via INTRAVENOUS
  Filled 2014-09-03: qty 8.5

## 2014-09-03 MED ORDER — POTASSIUM CHLORIDE 2 MEQ/ML IV SOLN
Freq: Once | INTRAVENOUS | Status: AC
  Administered 2014-09-03: 10:00:00 via INTRAVENOUS
  Filled 2014-09-03: qty 10

## 2014-09-03 MED ORDER — SODIUM CHLORIDE 0.9 % IV SOLN
20.0000 mg | Freq: Once | INTRAVENOUS | Status: AC
  Administered 2014-09-03: 20 mg via INTRAVENOUS
  Filled 2014-09-03: qty 2

## 2014-09-03 MED ORDER — SODIUM CHLORIDE 0.9 % IV SOLN
20.0000 mg/m2 | Freq: Once | INTRAVENOUS | Status: AC
  Administered 2014-09-03: 33 mg via INTRAVENOUS
  Filled 2014-09-03: qty 33

## 2014-09-03 MED ORDER — SODIUM CHLORIDE 0.9 % IV SOLN
Freq: Once | INTRAVENOUS | Status: AC
  Administered 2014-09-03: 09:00:00 via INTRAVENOUS

## 2014-09-03 MED ORDER — PALONOSETRON HCL INJECTION 0.25 MG/5ML
0.2500 mg | Freq: Once | INTRAVENOUS | Status: AC
  Administered 2014-09-03: 0.25 mg via INTRAVENOUS

## 2014-09-03 MED ORDER — PALONOSETRON HCL INJECTION 0.25 MG/5ML
INTRAVENOUS | Status: AC
  Filled 2014-09-03: qty 5

## 2014-09-03 NOTE — Progress Notes (Signed)
I faxed forms to [employer] for asst with aloxi   [phone]

## 2014-09-03 NOTE — Progress Notes (Signed)
I faxed form for poss asst with emend to [facility]  [phone]

## 2014-09-03 NOTE — Progress Notes (Signed)
Paperwork for [employer] Patient Assistance Program (PAP) Enrollment Form signed and given to [employer].

## 2014-09-03 NOTE — Patient Instructions (Signed)
[facility] Discharge Instructions for Patients Receiving Chemotherapy  Today you received the following chemotherapy agents: Cisplatin, Etoposide  To help prevent nausea and vomiting after your treatment, we encourage you to take your nausea medication: Zofran 8 mg every 8 hours as needed; Compazine 10 mg every 6 hours as needed.   If you develop nausea and vomiting that is not controlled by your nausea medication, call the clinic.   BELOW ARE SYMPTOMS THAT SHOULD BE REPORTED IMMEDIATELY:  *FEVER GREATER THAN 100.5 F  *CHILLS WITH OR WITHOUT FEVER  NAUSEA AND VOMITING THAT IS NOT CONTROLLED WITH YOUR NAUSEA MEDICATION  *UNUSUAL SHORTNESS OF BREATH  *UNUSUAL BRUISING OR BLEEDING  TENDERNESS IN MOUTH AND THROAT WITH OR WITHOUT PRESENCE OF ULCERS  *URINARY PROBLEMS  *BOWEL PROBLEMS  UNUSUAL RASH Items with * indicate a potential emergency and should be followed up as soon as possible.  Feel free to call the clinic you have any questions or concerns. The clinic phone number is [phone].  Please show the [place] at check-in to the Emergency Department and triage nurse.

## 2014-09-03 NOTE — Progress Notes (Signed)
I placed emend and aloxi asst forms on desk of nurse for dr. Alen Blew.

## 2014-09-07 ENCOUNTER — Encounter: Payer: Self-pay | Admitting: Physician Assistant

## 2014-09-07 ENCOUNTER — Ambulatory Visit (HOSPITAL_BASED_OUTPATIENT_CLINIC_OR_DEPARTMENT_OTHER): Payer: Self-pay

## 2014-09-07 ENCOUNTER — Other Ambulatory Visit (HOSPITAL_BASED_OUTPATIENT_CLINIC_OR_DEPARTMENT_OTHER): Payer: Self-pay

## 2014-09-07 ENCOUNTER — Ambulatory Visit (HOSPITAL_BASED_OUTPATIENT_CLINIC_OR_DEPARTMENT_OTHER): Payer: Self-pay | Admitting: Physician Assistant

## 2014-09-07 ENCOUNTER — Other Ambulatory Visit: Payer: Self-pay | Admitting: *Deleted

## 2014-09-07 VITALS — BP 111/67 | HR 73 | Temp 97.9°F | Resp 18 | Ht 68.0 in | Wt 135.0 lb

## 2014-09-07 DIAGNOSIS — C6290 Malignant neoplasm of unspecified testis, unspecified whether descended or undescended: Secondary | ICD-10-CM

## 2014-09-07 DIAGNOSIS — L0291 Cutaneous abscess, unspecified: Secondary | ICD-10-CM

## 2014-09-07 DIAGNOSIS — C6291 Malignant neoplasm of right testis, unspecified whether descended or undescended: Secondary | ICD-10-CM

## 2014-09-07 DIAGNOSIS — R110 Nausea: Secondary | ICD-10-CM

## 2014-09-07 DIAGNOSIS — Z5111 Encounter for antineoplastic chemotherapy: Secondary | ICD-10-CM

## 2014-09-07 DIAGNOSIS — L0390 Cellulitis, unspecified: Secondary | ICD-10-CM

## 2014-09-07 DIAGNOSIS — G893 Neoplasm related pain (acute) (chronic): Secondary | ICD-10-CM

## 2014-09-07 LAB — COMPREHENSIVE METABOLIC PANEL (CC13)
ALK PHOS: 57 U/L (ref 40–150)
ALT: 25 U/L (ref 0–55)
ANION GAP: 8 meq/L (ref 3–11)
AST: 15 U/L (ref 5–34)
Albumin: 3.4 g/dL — ABNORMAL LOW (ref 3.5–5.0)
BILIRUBIN TOTAL: 0.26 mg/dL (ref 0.20–1.20)
BUN: 17 mg/dL (ref 7.0–26.0)
CALCIUM: 8.9 mg/dL (ref 8.4–10.4)
CHLORIDE: 103 meq/L (ref 98–109)
CO2: 26 mEq/L (ref 22–29)
Creatinine: 0.9 mg/dL (ref 0.7–1.3)
Glucose: 93 mg/dl (ref 70–140)
POTASSIUM: 4.5 meq/L (ref 3.5–5.1)
SODIUM: 138 meq/L (ref 136–145)
Total Protein: 6.4 g/dL (ref 6.4–8.3)

## 2014-09-07 LAB — CBC WITH DIFFERENTIAL/PLATELET
BASO%: 0.2 % (ref 0.0–2.0)
BASOS ABS: 0 10*3/uL (ref 0.0–0.1)
EOS%: 2.4 % (ref 0.0–7.0)
Eosinophils Absolute: 0.1 10*3/uL (ref 0.0–0.5)
HEMATOCRIT: 40.1 % (ref 38.4–49.9)
HGB: 13.8 g/dL (ref 13.0–17.1)
LYMPH%: 20.7 % (ref 14.0–49.0)
MCH: 31.7 pg (ref 27.2–33.4)
MCHC: 34.4 g/dL (ref 32.0–36.0)
MCV: 92 fL (ref 79.3–98.0)
MONO#: 0 10*3/uL — AB (ref 0.1–0.9)
MONO%: 0.7 % (ref 0.0–14.0)
NEUT%: 76 % — AB (ref 39.0–75.0)
NEUTROS ABS: 4.2 10*3/uL (ref 1.5–6.5)
PLATELETS: 248 10*3/uL (ref 140–400)
RBC: 4.36 10*6/uL (ref 4.20–5.82)
RDW: 12.4 % (ref 11.0–14.6)
WBC: 5.5 10*3/uL (ref 4.0–10.3)
lymph#: 1.1 10*3/uL (ref 0.9–3.3)

## 2014-09-07 MED ORDER — BLEOMYCIN SULFATE CHEMO INJECTION 30 UNIT
30.0000 [IU] | Freq: Once | INTRAMUSCULAR | Status: AC
  Administered 2014-09-07: 30 [IU] via INTRAVENOUS
  Filled 2014-09-07: qty 10

## 2014-09-07 MED ORDER — SODIUM CHLORIDE 0.9 % IV SOLN
Freq: Once | INTRAVENOUS | Status: AC
  Administered 2014-09-07: 10:00:00 via INTRAVENOUS

## 2014-09-07 MED ORDER — PROCHLORPERAZINE MALEATE 10 MG PO TABS
ORAL_TABLET | ORAL | Status: AC
  Filled 2014-09-07: qty 1

## 2014-09-07 MED ORDER — HYDROCODONE-ACETAMINOPHEN 7.5-325 MG PO TABS: 1.0000 | ORAL_TABLET | Freq: Four times a day (QID) | ORAL | Status: DC | PRN

## 2014-09-07 MED ORDER — PROCHLORPERAZINE MALEATE 10 MG PO TABS
10.0000 mg | ORAL_TABLET | Freq: Once | ORAL | Status: AC
  Administered 2014-09-07: 10 mg via ORAL

## 2014-09-07 NOTE — Patient Instructions (Signed)
[facility] Discharge Instructions for Patients Receiving Chemotherapy  Today you received the following chemotherapy agents :  Bleomycin.  To help prevent nausea and vomiting after your treatment, we encourage you to take your nausea medication as prescribed.   If you develop nausea and vomiting that is not controlled by your nausea medication, call the clinic.   BELOW ARE SYMPTOMS THAT SHOULD BE REPORTED IMMEDIATELY:  *FEVER GREATER THAN 100.5 F  *CHILLS WITH OR WITHOUT FEVER  NAUSEA AND VOMITING THAT IS NOT CONTROLLED WITH YOUR NAUSEA MEDICATION  *UNUSUAL SHORTNESS OF BREATH  *UNUSUAL BRUISING OR BLEEDING  TENDERNESS IN MOUTH AND THROAT WITH OR WITHOUT PRESENCE OF ULCERS  *URINARY PROBLEMS  *BOWEL PROBLEMS  UNUSUAL RASH Items with * indicate a potential emergency and should be followed up as soon as possible.  Feel free to call the clinic you have any questions or concerns. The clinic phone number is [phone].  Please show the CHEMO ALERT CARD at check-in to the Emergency Department and triage nurse.   

## 2014-09-07 NOTE — Progress Notes (Signed)
Hematology and Oncology Follow Up Visit  Kelly Garza [id number] 1990-06-07 24 y.o. 09/07/2014 3:22 PM  Principle Diagnosis:Testis cancer   Staging form: Testis, AJCC 7th Edition     Clinical: pT1, N0, M0 - Unsigned  Prior Therapy: Status post right orchiectomy on 04/19/2014  Current therapy: Systemic chemotherapy with BEP.  Cycle #1 beginning 08/30/2014  Interim History:  Ron presents today accompanied by his significant other for a symptom management visit. Overall is tolerating his chemotherapy with BEP relatively well with the exception of some joint pain and nausea. He takes that the joint pain particularly in his knees but also affecting his elbows can be quite severe. He is currently taking 2-3 of his pain pills every 6 hours to get some relief. He also reports minimal nausea that is well-controlled with his current antiemetics. He denies fever, chills, vomiting, diarrhea or constipation. He reports an abscess on his neck that has developed over the past week. He states that he "popped it" last night and was able to drain a moderate amount of purulent/bloody material. He states that he has periodically developed these abscesses usually in his sacral area. He denies any respiratory symptomatology.  Medications: I have reviewed the patient's current medications.  Allergies: No Known Allergies  Past Medical History, Surgical history, Social history, and Family History were reviewed and updated.  Review of Systems: Review of Systems  Constitutional: Negative for fever, chills, weight loss, malaise/fatigue and diaphoresis.  HENT: Negative for congestion, ear discharge, ear pain, hearing loss, nosebleeds, sore throat and tinnitus.   Eyes: Negative for blurred vision, double vision, photophobia, pain, discharge and redness.  Respiratory: Negative for cough, hemoptysis, sputum production, shortness of breath, wheezing and stridor.   Cardiovascular: Negative for chest pain, palpitations,  orthopnea, claudication, leg swelling and PND.  Gastrointestinal: Positive for nausea. Negative for heartburn, vomiting, abdominal pain, diarrhea, constipation, blood in stool and melena.  Genitourinary: Negative.   Musculoskeletal: Positive for joint pain.  Skin: Negative.        Abscess/boil left neck area  Neurological: Negative for dizziness, tingling, focal weakness, seizures, weakness and headaches.  Endo/Heme/Allergies: Does not bruise/bleed easily.  Psychiatric/Behavioral: Negative for depression. The patient is not nervous/anxious and does not have insomnia.     Remaining ROS negative.  Physical Exam: Blood pressure 111/67, pulse 73, temperature 97.9 F (36.6 C), temperature source Oral, resp. rate 18, height 5\' 8"  (1.727 m), weight 135 lb (61.236 kg), SpO2 100 %. ECOG:  Physical Exam  Constitutional: He is oriented to person, place, and time and well-developed, well-nourished, and in no distress.  HENT:  Head: Normocephalic and atraumatic.  Mouth/Throat: Oropharynx is clear and moist.  Eyes: Pupils are equal, round, and reactive to light.  Neck: Normal range of motion. Neck supple. No JVD present. No tracheal deviation present. No thyromegaly present.  Cardiovascular: Normal rate, regular rhythm, normal heart sounds and intact distal pulses.  Exam reveals no gallop and no friction rub.   No murmur heard. Pulmonary/Chest: Effort normal and breath sounds normal. No respiratory distress. He has no wheezes. He has no rales.  Abdominal: Soft. Bowel sounds are normal. He exhibits no distension and no mass. There is no tenderness.  Musculoskeletal: Normal range of motion. He exhibits no edema or tenderness.  Lymphadenopathy:    He has no cervical adenopathy.  Neurological: He is alert and oriented to person, place, and time. He has normal reflexes. Gait normal.  Skin: Skin is warm and dry. No rash noted.  Erythematous open  wound left neck area with minimally enlarged nontender  cervical adenopathy. A small amount of purulent drainage was obtained for culture and sensitivity. Small cluster of his old previous similar lesions in the sacral area. No active/current lesions present      Lab Results: Lab Results  Component Value Date   WBC 5.5 09/07/2014   HGB 13.8 09/07/2014   HCT 40.1 09/07/2014   MCV 92.0 09/07/2014   PLT 248 09/07/2014     Chemistry      Component Value Date/Time   NA 138 09/07/2014 0857   K 4.5 09/07/2014 0857   CO2 26 09/07/2014 0857   BUN 17.0 09/07/2014 0857   CREATININE 0.9 09/07/2014 0857      Component Value Date/Time   CALCIUM 8.9 09/07/2014 0857   ALKPHOS 57 09/07/2014 0857   AST 15 09/07/2014 0857   ALT 25 09/07/2014 0857   BILITOT 0.26 09/07/2014 0857       Radiological Studies: Ct Chest W Contrast  08/25/2014   CLINICAL DATA:  Subsequent evaluation of a 24 year old male with history of 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 Evaluate for metastatic disease.  EXAM: CT CHEST, ABDOMEN, AND PELVIS WITH CONTRAST  TECHNIQUE: Multidetector CT imaging of the chest, abdomen and pelvis was performed following the standard protocol during bolus administration of intravenous contrast.  CONTRAST:  135mL OMNIPAQUE IOHEXOL 300 MG/ML  SOLN  COMPARISON:  No priors.  FINDINGS: CT CHEST FINDINGS  Mediastinum/Lymph Nodes: Heart size is normal. There is no significant pericardial fluid, thickening or pericardial calcification. Borderline enlarged right hilar lymph node measuring 9 mm in short axis is nonspecific. Mildly enlarged retrocrural lymph nodes measuring up to 1 cm in short axis on the right. Esophagus is unremarkable in appearance. No axillary lymphadenopathy.  Lungs/Pleura: Paraseptal emphysema is noted in the lungs bilaterally (left greater than right), generally mild, with exception of the lung bases. In particular, in the left lung base there are large subpleural bullae. Along the posterior margin of the  largest bulla in the wall inferior left hemithorax associated with the inferior aspect of left lower lobe there is a macrolobulated 12 x 6 mm nodule. A few other scattered tiny 2-3 mm pulmonary nodules are also noted, predominantly in a subpleural distribution, nonspecific and favored to represent subpleural lymph nodes, largest of which measures 3 mm (image 18 of series 4) in the posterior aspect of the left upper lobe. No acute consolidative airspace disease. No pleural effusions.  Musculoskeletal/Soft Tissues: There are no aggressive appearing lytic or blastic lesions noted in the visualized portions of the skeleton.  CT ABDOMEN AND PELVIS FINDINGS  Hepatobiliary: No cystic or solid hepatic lesions. No intra or extrahepatic biliary ductal dilatation. Gallbladder is normal in appearance.  Pancreas: No pancreatic mass. No pancreatic ductal dilatation. No pancreatic or peripancreatic fluid or inflammatory changes.  Spleen: Small splenule adjacent to the splenic hilum. Otherwise, unremarkable.  Adrenals/Urinary Tract: Left kidney and left adrenal gland are not visualized, and could be surgically absent (although there are no surgical clips or other surgical footprint in the left retroperitoneum) or congenitally absent (favored). Right adrenal gland and right kidney are normal in appearance. No hydroureteronephrosis. Urinary bladder is normal in appearance.  Stomach/Bowel: Normal appearance of the stomach. No pathologic dilatation of small bowel or colon.  Vascular/Lymphatic: No significant atherosclerotic disease, aneurysm or dissection identified in the abdominal or pelvic vasculature. Normal left renal artery origin not visualized. Borderline enlarged left inguinal and common femoral lymph  nodes noted, measuring up to 9 mm in short axis. No other definite lymphadenopathy noted in the abdomen or pelvis.  Reproductive: Prostate gland and seminal vesicles are unremarkable in appearance. Postoperative changes of left  orchiectomy incompletely visualized.  Other: No significant volume of ascites.  No pneumoperitoneum.  Musculoskeletal: There are no aggressive appearing lytic or blastic lesions noted in the visualized portions of the skeleton.  IMPRESSION: 1. Prominent borderline enlarged left inguinal and left common femoral lymph nodes, as well as mildly enlarged retrocrural lymph nodes, and a borderline enlarged right hilar lymph node. These findings are nonspecific, but metastatic disease is not excluded. Further evaluation with PET-CT is suggested at this time. 2. In addition, there is a 1.2 x 0.6 cm macrolobulated pulmonary nodule in the inferior aspect of the left lower lobe. This is highly nonspecific, and is favored to represent an area of chronically scarred lung parenchyma adjacent to the large bullae in the base of the left lower lobe. However, attention at time of follow-up PET-CT is suggested to exclude hypermetabolism, as an isolated metastatic lesion is not excluded. Other tiny pulmonary nodules in lungs bilaterally are favored to represent subpleural lymph nodes but warrant continued attention on future follow-up imaging. 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 4. Unusual appearance of the left lung, with generalized mild paraseptal emphysema, which is far more pronounced at the left lung base where there are several large bullae. The findings at the left lung base could be explained by the presence of bronchial atresia leading up to the affected segment of lung, however, this would not account for the mild generalized paraseptal emphysema in the left lung. Pulmonology referral could be considered for further evaluation.   Electronically Signed   By: Vinnie Langton M.D.   On: 08/25/2014 09:24   Ct Abdomen Pelvis W Contrast  08/25/2014   CLINICAL DATA:  Subsequent evaluation of a 24 year old male with history of 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 Evaluate for metastatic  disease.  EXAM: CT CHEST, ABDOMEN, AND PELVIS WITH CONTRAST  TECHNIQUE: Multidetector CT imaging of the chest, abdomen and pelvis was performed following the standard protocol during bolus administration of intravenous contrast.  CONTRAST:  146mL OMNIPAQUE IOHEXOL 300 MG/ML  SOLN  COMPARISON:  No priors.  FINDINGS: CT CHEST FINDINGS  Mediastinum/Lymph Nodes: Heart size is normal. There is no significant pericardial fluid, thickening or pericardial calcification. Borderline enlarged right hilar lymph node measuring 9 mm in short axis is nonspecific. Mildly enlarged retrocrural lymph nodes measuring up to 1 cm in short axis on the right. Esophagus is unremarkable in appearance. No axillary lymphadenopathy.  Lungs/Pleura: Paraseptal emphysema is noted in the lungs bilaterally (left greater than right), generally mild, with exception of the lung bases. In particular, in the left lung base there are large subpleural bullae. Along the posterior margin of the largest bulla in the wall inferior left hemithorax associated with the inferior aspect of left lower lobe there is a macrolobulated 12 x 6 mm nodule. A few other scattered tiny 2-3 mm pulmonary nodules are also noted, predominantly in a subpleural distribution, nonspecific and favored to represent subpleural lymph nodes, largest of which measures 3 mm (image 18 of series 4) in the posterior aspect of the left upper lobe. No acute consolidative airspace disease. No pleural effusions.  Musculoskeletal/Soft Tissues: There are no aggressive appearing lytic or blastic lesions noted in the visualized portions of the skeleton.  CT ABDOMEN AND PELVIS FINDINGS  Hepatobiliary: No cystic  or solid hepatic lesions. No intra or extrahepatic biliary ductal dilatation. Gallbladder is normal in appearance.  Pancreas: No pancreatic mass. No pancreatic ductal dilatation. No pancreatic or peripancreatic fluid or inflammatory changes.  Spleen: Small splenule adjacent to the splenic hilum.  Otherwise, unremarkable.  Adrenals/Urinary Tract: Left kidney and left adrenal gland are not visualized, and could be surgically absent (although there are no surgical clips or other surgical footprint in the left retroperitoneum) or congenitally absent (favored). Right adrenal gland and right kidney are normal in appearance. No hydroureteronephrosis. Urinary bladder is normal in appearance.  Stomach/Bowel: Normal appearance of the stomach. No pathologic dilatation of small bowel or colon.  Vascular/Lymphatic: No significant atherosclerotic disease, aneurysm or dissection identified in the abdominal or pelvic vasculature. Normal left renal artery origin not visualized. Borderline enlarged left inguinal and common femoral lymph nodes noted, measuring up to 9 mm in short axis. No other definite lymphadenopathy noted in the abdomen or pelvis.  Reproductive: Prostate gland and seminal vesicles are unremarkable in appearance. Postoperative changes of left orchiectomy incompletely visualized.  Other: No significant volume of ascites.  No pneumoperitoneum.  Musculoskeletal: There are no aggressive appearing lytic or blastic lesions noted in the visualized portions of the skeleton.  IMPRESSION: 1. Prominent borderline enlarged left inguinal and left common femoral lymph nodes, as well as mildly enlarged retrocrural lymph nodes, and a borderline enlarged right hilar lymph node. These findings are nonspecific, but metastatic disease is not excluded. Further evaluation with PET-CT is suggested at this time. 2. In addition, there is a 1.2 x 0.6 cm macrolobulated pulmonary nodule in the inferior aspect of the left lower lobe. This is highly nonspecific, and is favored to represent an area of chronically scarred lung parenchyma adjacent to the large bullae in the base of the left lower lobe. However, attention at time of follow-up PET-CT is suggested to exclude hypermetabolism, as an isolated metastatic lesion is not excluded.  Other tiny pulmonary nodules in lungs bilaterally are favored to represent subpleural lymph nodes but warrant continued attention on future follow-up imaging. 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 4. Unusual appearance of the left lung, with generalized mild paraseptal emphysema, which is far more pronounced at the left lung base where there are several large bullae. The findings at the left lung base could be explained by the presence of bronchial atresia leading up to the affected segment of lung, however, this would not account for the mild generalized paraseptal emphysema in the left lung. Pulmonology referral could be considered for further evaluation.   Electronically Signed   By: Vinnie Langton M.D.   On: 08/25/2014 09:24    Impression and Plan: Patient is a very pleasant 24 year old Caucasian male who was diagnosed with right testicular cancer status post right orchiectomy and 04/19/2014. He is currently undergoing systemic chemotherapy with BEP and is tolerating the chemotherapy relatively well. 2 improve his pain management changed his Vicodin from a 3/25 mg tablet to a 7.5/325 mg tablet with the directions for him to take one tablet every 6 hours as needed for pain. The wound on his neck from a boil/abscess was cultured. An appropriate antibody therapy will be instituted pending the results of the culture and sensitivity. He will continue with his chemotherapy as scheduled and follow-up in one week for further evaluation  Spent more than half the time coordinating care.    Carlton Adam, PA-C 7/26/20163:22 PM

## 2014-09-08 NOTE — Patient Instructions (Signed)
Continue labs and chemotherapy as scheduled Follow-up in one week 

## 2014-09-09 ENCOUNTER — Telehealth: Payer: Self-pay

## 2014-09-09 LAB — WOUND CULTURE

## 2014-09-09 MED ORDER — SULFAMETHOXAZOLE-TRIMETHOPRIM 800-160 MG PO TABS: 1.0000 | ORAL_TABLET | Freq: Two times a day (BID) | ORAL | Status: DC

## 2014-09-09 NOTE — Telephone Encounter (Signed)
Called and informed pt of culture results- Moderate MRSA. Informed pt prescription for Bactrim DS will be called into pharmacy, take 1 tab BID for 14 days. Pt verbalized understanding and understands to call with any questions or concerns. Prescription sent to [facility].

## 2014-09-14 ENCOUNTER — Telehealth: Payer: Self-pay | Admitting: *Deleted

## 2014-09-14 ENCOUNTER — Other Ambulatory Visit: Payer: Self-pay | Admitting: *Deleted

## 2014-09-14 ENCOUNTER — Other Ambulatory Visit (HOSPITAL_BASED_OUTPATIENT_CLINIC_OR_DEPARTMENT_OTHER): Payer: Self-pay

## 2014-09-14 ENCOUNTER — Ambulatory Visit (HOSPITAL_BASED_OUTPATIENT_CLINIC_OR_DEPARTMENT_OTHER): Payer: Self-pay | Admitting: Oncology

## 2014-09-14 ENCOUNTER — Ambulatory Visit (HOSPITAL_BASED_OUTPATIENT_CLINIC_OR_DEPARTMENT_OTHER): Payer: Self-pay

## 2014-09-14 ENCOUNTER — Telehealth: Payer: Self-pay | Admitting: Oncology

## 2014-09-14 VITALS — BP 104/69 | HR 61 | Temp 97.6°F | Resp 18 | Ht 68.0 in | Wt 135.3 lb

## 2014-09-14 DIAGNOSIS — C6291 Malignant neoplasm of right testis, unspecified whether descended or undescended: Secondary | ICD-10-CM

## 2014-09-14 DIAGNOSIS — D702 Other drug-induced agranulocytosis: Secondary | ICD-10-CM

## 2014-09-14 DIAGNOSIS — C6290 Malignant neoplasm of unspecified testis, unspecified whether descended or undescended: Secondary | ICD-10-CM

## 2014-09-14 DIAGNOSIS — Z5111 Encounter for antineoplastic chemotherapy: Secondary | ICD-10-CM

## 2014-09-14 DIAGNOSIS — G893 Neoplasm related pain (acute) (chronic): Secondary | ICD-10-CM

## 2014-09-14 LAB — COMPREHENSIVE METABOLIC PANEL (CC13)
ALK PHOS: 59 U/L (ref 40–150)
ALT: 19 U/L (ref 0–55)
AST: 14 U/L (ref 5–34)
Albumin: 3.7 g/dL (ref 3.5–5.0)
Anion Gap: 7 mEq/L (ref 3–11)
BUN: 11 mg/dL (ref 7.0–26.0)
CHLORIDE: 109 meq/L (ref 98–109)
CO2: 24 mEq/L (ref 22–29)
CREATININE: 0.8 mg/dL (ref 0.7–1.3)
Calcium: 9.1 mg/dL (ref 8.4–10.4)
Glucose: 88 mg/dl (ref 70–140)
Potassium: 3.9 mEq/L (ref 3.5–5.1)
Sodium: 140 mEq/L (ref 136–145)
TOTAL PROTEIN: 7 g/dL (ref 6.4–8.3)
Total Bilirubin: 0.2 mg/dL (ref 0.20–1.20)

## 2014-09-14 LAB — CBC WITH DIFFERENTIAL/PLATELET
BASO%: 4.8 % — ABNORMAL HIGH (ref 0.0–2.0)
BASOS ABS: 0.1 10*3/uL (ref 0.0–0.1)
EOS%: 3.8 % (ref 0.0–7.0)
Eosinophils Absolute: 0.1 10*3/uL (ref 0.0–0.5)
HEMATOCRIT: 43.1 % (ref 38.4–49.9)
HGB: 14.6 g/dL (ref 13.0–17.1)
LYMPH#: 0.9 10*3/uL (ref 0.9–3.3)
LYMPH%: 49.3 % — ABNORMAL HIGH (ref 14.0–49.0)
MCH: 31.8 pg (ref 27.2–33.4)
MCHC: 33.9 g/dL (ref 32.0–36.0)
MCV: 93.7 fL (ref 79.3–98.0)
MONO#: 0.3 10*3/uL (ref 0.1–0.9)
MONO%: 13.2 % (ref 0.0–14.0)
NEUT%: 28.9 % — AB (ref 39.0–75.0)
NEUTROS ABS: 0.5 10*3/uL — AB (ref 1.5–6.5)
Platelets: 216 10*3/uL (ref 140–400)
RBC: 4.6 10*6/uL (ref 4.20–5.82)
RDW: 13.3 % (ref 11.0–14.6)
WBC: 1.9 10*3/uL — ABNORMAL LOW (ref 4.0–10.3)

## 2014-09-14 MED ORDER — HYDROCODONE-ACETAMINOPHEN 7.5-325 MG PO TABS: 1.0000 | ORAL_TABLET | Freq: Four times a day (QID) | ORAL | Status: DC | PRN

## 2014-09-14 MED ORDER — PROCHLORPERAZINE MALEATE 10 MG PO TABS
ORAL_TABLET | ORAL | Status: AC
  Filled 2014-09-14: qty 1

## 2014-09-14 MED ORDER — PROCHLORPERAZINE MALEATE 10 MG PO TABS
10.0000 mg | ORAL_TABLET | Freq: Once | ORAL | Status: AC
Start: 2014-09-14 — End: 2014-09-14
  Administered 2014-09-14: 10 mg via ORAL

## 2014-09-14 MED ORDER — SODIUM CHLORIDE 0.9 % IV SOLN
30.0000 [IU] | Freq: Once | INTRAVENOUS | Status: AC
  Administered 2014-09-14: 30 [IU] via INTRAVENOUS
  Filled 2014-09-14: qty 10

## 2014-09-14 MED ORDER — SODIUM CHLORIDE 0.9 % IV SOLN
Freq: Once | INTRAVENOUS | Status: AC
  Administered 2014-09-14: 09:00:00 via INTRAVENOUS

## 2014-09-14 NOTE — Progress Notes (Signed)
Hematology and Oncology Follow Up Visit  Kelly Garza [id number] 12-08-1990 24 y.o. 09/14/2014 8:46 AM No PCP Per PatientNo ref. 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 He had LV I an elevated tumor marker of alpha-fetoprotein is 2038 and beta-hCG of 942. Imaging studies which showed pulmonary nodules suspicious for stage IIIa disease.   Prior Therapy: He is status post orchiectomy done on 04/19/2014.  Current therapy: BEP systemic chemotherapy started on 08/30/2014. He is here for day 16 of cycle 1.  Interim History: Kelly Garza presents today for a follow-up visit. Since the last visit, he continues to tolerate systemic chemotherapy very well. He did not have any complications related to bleomycin. He did not report any fevers or infusion related complications. He does not have any pulmonary symptoms such as chest pain, shortness of breath or cough. He did develop skin rash related to sun screen which she have changed recently. He does not report any chills or sweats. He does not report any nausea or vomiting. He is able to perform activities of daily living without any decline.  He does not report any headaches, blurry vision, syncope or seizures. He does not report any fevers, chills, sweats or weight loss. He does not report any chest pain, palpitation, orthopnea or PND. He does not report any cough, wheezing, hemoptysis, shortness of breath, dyspnea on exertion or difficulty breathing. He does not report any nausea, vomiting, abdominal pain, constipation, diarrhea, hematochezia or change in his bowel habits. He does not report any frequency, urgency, hesitancy or hematuria. He does not report any skeletal complaints including arthralgias myalgias. He does not report any total cold intolerance. Does not report any  mood disorder clearing anxiety or depression. Remaining review of systems unremarkable  Medications: I have reviewed the patient's current medications.  Current Outpatient Prescriptions  Medication Sig Dispense Refill  . albuterol (PROVENTIL HFA;VENTOLIN HFA) 108 (90 BASE) MCG/ACT inhaler Inhale into the lungs every 6 (six) hours as needed for wheezing or shortness of breath.    Marland Kitchen HYDROcodone-acetaminophen (NORCO) 7.5-325 MG per tablet Take 1 tablet by mouth every 6 (six) hours as needed for moderate pain. 30 tablet 0  . HYDROcodone-acetaminophen (NORCO/VICODIN) 5-325 MG per tablet Take 1 tablet by mouth every 6 (six) hours as needed for moderate pain. 30 tablet 0  . ibuprofen (ADVIL,MOTRIN) 200 MG tablet Take 200 mg by mouth every 6 (six) hours as needed.    Marland Kitchen LORazepam (ATIVAN) 1 MG tablet Take 1 tablet (1 mg total) by mouth at bedtime as needed for anxiety or sleep. 30 tablet 1  . nicotine (NICODERM CQ - DOSED IN MG/24 HR) 7 mg/24hr patch Place 7 mg onto the skin daily.    . ondansetron (ZOFRAN) 8 MG tablet Take 1 tablet (8 mg total) by mouth every 8 (eight) hours as needed for nausea or vomiting. 20 tablet 0  . prochlorperazine (COMPAZINE) 10 MG tablet Take 1 tablet (10 mg total) by mouth every 6 (six) hours as needed for nausea or vomiting. 30 tablet 0  . sulfamethoxazole-trimethoprim (BACTRIM DS,SEPTRA DS) 800-160 MG per tablet Take 1 tablet by mouth 2 (two) times daily. X 14 days 28 tablet 0  . testosterone enanthate (DELATESTRYL) 200 MG/ML injection Inject 200 mg into the muscle every 14 (fourteen) days. For IM use only     No  current facility-administered medications for this visit.     Allergies: No Known Allergies  Past Medical History, Surgical history, Social history, and Family History were reviewed and updated.   Physical Exam: Blood pressure 104/69, pulse 61, temperature 97.6 F (36.4 C), temperature source Oral, resp. rate 18, height 5\' 8"  (1.727 m), weight 135 lb 4.8 oz  (61.372 kg), SpO2 100 %. ECOG: 0 General appearance: alert and cooperative Head: Normocephalic, without obvious abnormality. Slight erythema noted under the right jaw. Neck: no adenopathy, no carotid bruit, no JVD and supple, symmetrical, trachea midline Lymph nodes: Cervical, supraclavicular, and axillary nodes normal. Heart:regular rate and rhythm, S1, S2 normal, no murmur, click, rub or gallop Lung:chest clear, no wheezing, rales, normal symmetric air entry Abdomin: soft, non-tender, without masses or organomegaly EXT:no erythema, induration, or nodules Skin: Eruptions noted in the back and shoulders without any erythema or induration.  Lab Results: Lab Results  Component Value Date   WBC 1.9* 09/14/2014   HGB 14.6 09/14/2014   HCT 43.1 09/14/2014   MCV 93.7 09/14/2014   PLT 216 09/14/2014     Chemistry      Component Value Date/Time   NA 138 09/07/2014 0857   K 4.5 09/07/2014 0857   CO2 26 09/07/2014 0857   BUN 17.0 09/07/2014 0857   CREATININE 0.9 09/07/2014 0857      Component Value Date/Time   CALCIUM 8.9 09/07/2014 0857   ALKPHOS 57 09/07/2014 0857   AST 15 09/07/2014 0857   ALT 25 09/07/2014 0857   BILITOT 0.26 09/07/2014 0857       Radiological Studies: CLINICAL DATA: Subsequent evaluation of a 24 year old male with history of 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 Evaluate for metastatic disease.  EXAM: CT CHEST, ABDOMEN, AND PELVIS WITH CONTRAST  TECHNIQUE: Multidetector CT imaging of the chest, abdomen and pelvis was performed following the standard protocol during bolus administration of intravenous contrast.  CONTRAST: 181mL OMNIPAQUE IOHEXOL 300 MG/ML SOLN  COMPARISON: No priors.  FINDINGS: CT CHEST FINDINGS  Mediastinum/Lymph Nodes: Heart size is normal. There is no significant pericardial fluid, thickening or pericardial calcification. Borderline enlarged right hilar lymph node measuring 9 mm  in short axis is nonspecific. Mildly enlarged retrocrural lymph nodes measuring up to 1 cm in short axis on the right. Esophagus is unremarkable in appearance. No axillary lymphadenopathy.  Lungs/Pleura: Paraseptal emphysema is noted in the lungs bilaterally (left greater than right), generally mild, with exception of the lung bases. In particular, in the left lung base there are large subpleural bullae. Along the posterior margin of the largest bulla in the wall inferior left hemithorax associated with the inferior aspect of left lower lobe there is a macrolobulated 12 x 6 mm nodule. A few other scattered tiny 2-3 mm pulmonary nodules are also noted, predominantly in a subpleural distribution, nonspecific and favored to represent subpleural lymph nodes, largest of which measures 3 mm (image 18 of series 4) in the posterior aspect of the left upper lobe. No acute consolidative airspace disease. No pleural effusions.  Musculoskeletal/Soft Tissues: There are no aggressive appearing lytic or blastic lesions noted in the visualized portions of the skeleton.  CT ABDOMEN AND PELVIS FINDINGS  Hepatobiliary: No cystic or solid hepatic lesions. No intra or extrahepatic biliary ductal dilatation. Gallbladder is normal in appearance.  Pancreas: No pancreatic mass. No pancreatic ductal dilatation. No pancreatic or peripancreatic fluid or inflammatory changes.  Spleen: Small splenule adjacent to the splenic hilum. Otherwise, unremarkable.  Adrenals/Urinary  Tract: Left kidney and left adrenal gland are not visualized, and could be surgically absent (although there are no surgical clips or other surgical footprint in the left retroperitoneum) or congenitally absent (favored). Right adrenal gland and right kidney are normal in appearance. No hydroureteronephrosis. Urinary bladder is normal in appearance.  Stomach/Bowel: Normal appearance of the stomach. No pathologic dilatation of  small bowel or colon.  Vascular/Lymphatic: No significant atherosclerotic disease, aneurysm or dissection identified in the abdominal or pelvic vasculature. Normal left renal artery origin not visualized. Borderline enlarged left inguinal and common femoral lymph nodes noted, measuring up to 9 mm in short axis. No other definite lymphadenopathy noted in the abdomen or pelvis.  Reproductive: Prostate gland and seminal vesicles are unremarkable in appearance. Postoperative changes of left orchiectomy incompletely visualized.  Other: No significant volume of ascites. No pneumoperitoneum.  Musculoskeletal: There are no aggressive appearing lytic or blastic lesions noted in the visualized portions of the skeleton.  IMPRESSION: 1. Prominent borderline enlarged left inguinal and left common femoral lymph nodes, as well as mildly enlarged retrocrural lymph nodes, and a borderline enlarged right hilar lymph node. These findings are nonspecific, but metastatic disease is not excluded. Further evaluation with PET-CT is suggested at this time. 2. In addition, there is a 1.2 x 0.6 cm macrolobulated pulmonary nodule in the inferior aspect of the left lower lobe. This is highly nonspecific, and is favored to represent an area of chronically scarred lung parenchyma adjacent to the large bullae in the base of the left lower lobe. However, attention at time of follow-up PET-CT is suggested to exclude hypermetabolism, as an isolated metastatic lesion is not excluded. Other tiny pulmonary nodules in lungs bilaterally are favored to represent subpleural lymph nodes but warrant continued attention on future follow-up imaging. 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 4. Unusual appearance of the left lung, with generalized mild paraseptal emphysema, which is far more pronounced at the left lung base where there are several large bullae. 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 Pulmonology referral could be considered for further evaluation.  Impression and Plan:   24 year old gentleman with the following issues:  1. 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 The pathology showed a focal LVI . His tumor markers were elevated with alpha-fetoprotein of 2038 and a beta-hCG of 942. Alpha-fetoprotein normalized postoperatively and his beta-hCG after a nadir of 18 was climbing up to 26.  Imaging studies including a CT scan on 08/25/2014 was reviewed and indicated  pulmonary nodules that could be suspicious for malignancy. His staging including stage IS versus stage IIIa.  He is currently receiving BEP systemic chemotherapy without any major complications. He is ready to proceed with day 16 of cycle 1. He will return to start cycle 2 on 09/20/2014. The plan is to proceed with 3 cycles.  The goal of systemic chemotherapy is curative at this time.  2. Pulmonary toxicity prophylaxis: His pulmonary function tests showed a normal DLCO. I continue to question him about respiratory symptoms associated with the bleomycin. I also educated him about smoking cessation. He reports that he does not smoked since the start of  chemotherapy.  3. Neutropenia prophylaxis: I will add Neulasta to his chemotherapy regimen if his neutropenia persists at the beginning of cycle 2.  4. IV access: He declined a Port-A-Cath and currently using the peripheral veins.  5. Antiemetics: Prescription for Zofran and Compazine available to the patient and seems to be working at this time.  6. Fertility issues: He has underwent sperm banking prior to his orchiectomy  in March 2016.  7. Neck abscess: He is finishing a course of antibodies and seems to have resolved.  8. Follow-up: With cycle 2 on 09/20/2014.     [facility], MD 8/2/20168:46 AM

## 2014-09-14 NOTE — Progress Notes (Signed)
Per Dr. Alen Blew, okay to tx patient despite [place] 0.5

## 2014-09-14 NOTE — Telephone Encounter (Signed)
Pt confirmed labs/ov per 08/02 POF, gave pt avs and calendar.... KJ, sent msg to move chemo down due to adding MD visit

## 2014-09-14 NOTE — Telephone Encounter (Signed)
I have adjusted appt 

## 2014-09-14 NOTE — Patient Instructions (Signed)
[facility] Discharge Instructions for Patients Receiving Chemotherapy  Today you received the following chemotherapy agents :  Bleomycin.  To help prevent nausea and vomiting after your treatment, we encourage you to take your nausea medication as prescribed.   If you develop nausea and vomiting that is not controlled by your nausea medication, call the clinic.   BELOW ARE SYMPTOMS THAT SHOULD BE REPORTED IMMEDIATELY:  *FEVER GREATER THAN 100.5 F  *CHILLS WITH OR WITHOUT FEVER  NAUSEA AND VOMITING THAT IS NOT CONTROLLED WITH YOUR NAUSEA MEDICATION  *UNUSUAL SHORTNESS OF BREATH  *UNUSUAL BRUISING OR BLEEDING  TENDERNESS IN MOUTH AND THROAT WITH OR WITHOUT PRESENCE OF ULCERS  *URINARY PROBLEMS  *BOWEL PROBLEMS  UNUSUAL RASH Items with * indicate a potential emergency and should be followed up as soon as possible.  Feel free to call the clinic you have any questions or concerns. The clinic phone number is [phone].  Please show the CHEMO ALERT CARD at check-in to the Emergency Department and triage nurse.   

## 2014-09-20 ENCOUNTER — Other Ambulatory Visit: Payer: Self-pay | Admitting: *Deleted

## 2014-09-20 ENCOUNTER — Other Ambulatory Visit (HOSPITAL_BASED_OUTPATIENT_CLINIC_OR_DEPARTMENT_OTHER): Payer: Self-pay

## 2014-09-20 ENCOUNTER — Ambulatory Visit (HOSPITAL_BASED_OUTPATIENT_CLINIC_OR_DEPARTMENT_OTHER): Payer: Self-pay

## 2014-09-20 VITALS — BP 124/64 | HR 66 | Temp 97.1°F | Resp 18

## 2014-09-20 DIAGNOSIS — C6290 Malignant neoplasm of unspecified testis, unspecified whether descended or undescended: Secondary | ICD-10-CM

## 2014-09-20 DIAGNOSIS — C6291 Malignant neoplasm of right testis, unspecified whether descended or undescended: Secondary | ICD-10-CM

## 2014-09-20 DIAGNOSIS — Z5111 Encounter for antineoplastic chemotherapy: Secondary | ICD-10-CM

## 2014-09-20 LAB — CBC WITH DIFFERENTIAL/PLATELET
BASO%: 1.1 % (ref 0.0–2.0)
Basophils Absolute: 0 10*3/uL (ref 0.0–0.1)
EOS%: 3.4 % (ref 0.0–7.0)
Eosinophils Absolute: 0.1 10*3/uL (ref 0.0–0.5)
HEMATOCRIT: 42.2 % (ref 38.4–49.9)
HGB: 14.4 g/dL (ref 13.0–17.1)
LYMPH%: 37.1 % (ref 14.0–49.0)
MCH: 31.9 pg (ref 27.2–33.4)
MCHC: 34.1 g/dL (ref 32.0–36.0)
MCV: 93.6 fL (ref 79.3–98.0)
MONO#: 0.7 10*3/uL (ref 0.1–0.9)
MONO%: 21 % — AB (ref 0.0–14.0)
NEUT#: 1.3 10*3/uL — ABNORMAL LOW (ref 1.5–6.5)
NEUT%: 37.4 % — ABNORMAL LOW (ref 39.0–75.0)
PLATELETS: 327 10*3/uL (ref 140–400)
RBC: 4.51 10*6/uL (ref 4.20–5.82)
RDW: 13.5 % (ref 11.0–14.6)
WBC: 3.5 10*3/uL — AB (ref 4.0–10.3)
lymph#: 1.3 10*3/uL (ref 0.9–3.3)

## 2014-09-20 LAB — COMPREHENSIVE METABOLIC PANEL (CC13)
ALBUMIN: 3.6 g/dL (ref 3.5–5.0)
ALT: 18 U/L (ref 0–55)
ANION GAP: 7 meq/L (ref 3–11)
AST: 15 U/L (ref 5–34)
Alkaline Phosphatase: 65 U/L (ref 40–150)
BUN: 4.5 mg/dL — ABNORMAL LOW (ref 7.0–26.0)
CO2: 28 mEq/L (ref 22–29)
CREATININE: 0.9 mg/dL (ref 0.7–1.3)
Calcium: 9.2 mg/dL (ref 8.4–10.4)
Chloride: 108 mEq/L (ref 98–109)
Glucose: 93 mg/dl (ref 70–140)
POTASSIUM: 4.5 meq/L (ref 3.5–5.1)
Sodium: 142 mEq/L (ref 136–145)
Total Bilirubin: <0.2 mg/dL (ref 0.20–1.20)
Total Protein: 6.5 g/dL (ref 6.4–8.3)

## 2014-09-20 LAB — LACTATE DEHYDROGENASE (CC13): LDH: 160 U/L (ref 125–245)

## 2014-09-20 MED ORDER — POTASSIUM CHLORIDE 2 MEQ/ML IV SOLN
Freq: Once | INTRAVENOUS | Status: AC
  Administered 2014-09-20: 10:00:00 via INTRAVENOUS
  Filled 2014-09-20: qty 10

## 2014-09-20 MED ORDER — SODIUM CHLORIDE 0.9 % IV SOLN
100.0000 mg/m2 | Freq: Once | INTRAVENOUS | Status: AC
  Administered 2014-09-20: 170 mg via INTRAVENOUS
  Filled 2014-09-20: qty 8.5

## 2014-09-20 MED ORDER — FOSAPREPITANT DIMEGLUMINE INJECTION 150 MG
Freq: Once | INTRAVENOUS | Status: AC
  Administered 2014-09-20: 12:00:00 via INTRAVENOUS
  Filled 2014-09-20: qty 5

## 2014-09-20 MED ORDER — PALONOSETRON HCL INJECTION 0.25 MG/5ML
0.2500 mg | Freq: Once | INTRAVENOUS | Status: AC
  Administered 2014-09-20: 0.25 mg via INTRAVENOUS

## 2014-09-20 MED ORDER — SODIUM CHLORIDE 0.9 % IV SOLN
Freq: Once | INTRAVENOUS | Status: AC
  Administered 2014-09-20: 10:00:00 via INTRAVENOUS

## 2014-09-20 MED ORDER — PALONOSETRON HCL INJECTION 0.25 MG/5ML
INTRAVENOUS | Status: AC
  Filled 2014-09-20: qty 5

## 2014-09-20 MED ORDER — PROCHLORPERAZINE MALEATE 10 MG PO TABS: 10.0000 mg | ORAL_TABLET | Freq: Four times a day (QID) | ORAL | Status: DC | PRN

## 2014-09-20 MED ORDER — SODIUM CHLORIDE 0.9 % IV SOLN
20.0000 mg/m2 | Freq: Once | INTRAVENOUS | Status: AC
  Administered 2014-09-20: 33 mg via INTRAVENOUS
  Filled 2014-09-20: qty 33

## 2014-09-20 NOTE — Progress Notes (Signed)
Pre Cisplatin void was 400. Post Cisplatin void was 1200.

## 2014-09-20 NOTE — Patient Instructions (Signed)
[facility] Discharge Instructions for Patients Receiving Chemotherapy  Today you received the following chemotherapy agents: Cisplatin and Etoposide.  To help prevent nausea and vomiting after your treatment, we encourage you to take your nausea medication as prescribed.   If you develop nausea and vomiting that is not controlled by your nausea medication, call the clinic.   BELOW ARE SYMPTOMS THAT SHOULD BE REPORTED IMMEDIATELY:  *FEVER GREATER THAN 100.5 F  *CHILLS WITH OR WITHOUT FEVER  NAUSEA AND VOMITING THAT IS NOT CONTROLLED WITH YOUR NAUSEA MEDICATION  *UNUSUAL SHORTNESS OF BREATH  *UNUSUAL BRUISING OR BLEEDING  TENDERNESS IN MOUTH AND THROAT WITH OR WITHOUT PRESENCE OF ULCERS  *URINARY PROBLEMS  *BOWEL PROBLEMS  UNUSUAL RASH Items with * indicate a potential emergency and should be followed up as soon as possible.  Feel free to call the clinic you have any questions or concerns. The clinic phone number is [phone].  Please show the CHEMO ALERT CARD at check-in to the Emergency Department and triage nurse.   

## 2014-09-20 NOTE — Progress Notes (Signed)
Per Dr. Alen Blew, it's OK to treat with an [place] of 1.3.

## 2014-09-21 ENCOUNTER — Other Ambulatory Visit: Payer: Self-pay | Admitting: *Deleted

## 2014-09-21 ENCOUNTER — Ambulatory Visit (HOSPITAL_BASED_OUTPATIENT_CLINIC_OR_DEPARTMENT_OTHER): Payer: Self-pay

## 2014-09-21 VITALS — BP 124/60 | HR 80 | Temp 97.4°F | Resp 16

## 2014-09-21 DIAGNOSIS — C6290 Malignant neoplasm of unspecified testis, unspecified whether descended or undescended: Secondary | ICD-10-CM

## 2014-09-21 DIAGNOSIS — G893 Neoplasm related pain (acute) (chronic): Secondary | ICD-10-CM

## 2014-09-21 DIAGNOSIS — Z5111 Encounter for antineoplastic chemotherapy: Secondary | ICD-10-CM

## 2014-09-21 MED ORDER — SODIUM CHLORIDE 0.9 % IV SOLN
20.0000 mg | Freq: Once | INTRAVENOUS | Status: AC
  Administered 2014-09-21: 20 mg via INTRAVENOUS
  Filled 2014-09-21: qty 2

## 2014-09-21 MED ORDER — SODIUM CHLORIDE 0.9 % IV SOLN
20.0000 mg/m2 | Freq: Once | INTRAVENOUS | Status: AC
  Administered 2014-09-21: 33 mg via INTRAVENOUS
  Filled 2014-09-21: qty 33

## 2014-09-21 MED ORDER — SODIUM CHLORIDE 0.9 % IV SOLN
Freq: Once | INTRAVENOUS | Status: AC
  Administered 2014-09-21: 10:00:00 via INTRAVENOUS

## 2014-09-21 MED ORDER — SODIUM CHLORIDE 0.9 % IV SOLN
30.0000 [IU] | Freq: Once | INTRAVENOUS | Status: AC
  Administered 2014-09-21: 30 [IU] via INTRAVENOUS
  Filled 2014-09-21: qty 10

## 2014-09-21 MED ORDER — SODIUM CHLORIDE 0.9 % IV SOLN
100.0000 mg/m2 | Freq: Once | INTRAVENOUS | Status: AC
  Administered 2014-09-21: 170 mg via INTRAVENOUS
  Filled 2014-09-21: qty 8.5

## 2014-09-21 MED ORDER — POTASSIUM CHLORIDE 2 MEQ/ML IV SOLN
Freq: Once | INTRAVENOUS | Status: AC
  Administered 2014-09-21: 09:00:00 via INTRAVENOUS
  Filled 2014-09-21: qty 10

## 2014-09-21 MED ORDER — ONDANSETRON HCL 8 MG PO TABS: 8.0000 mg | ORAL_TABLET | Freq: Three times a day (TID) | ORAL | Status: DC | PRN

## 2014-09-21 MED ORDER — HYDROCODONE-ACETAMINOPHEN 7.5-325 MG PO TABS: 1.0000 | ORAL_TABLET | Freq: Four times a day (QID) | ORAL | Status: DC | PRN

## 2014-09-21 NOTE — Patient Instructions (Signed)
[facility] Discharge Instructions for Patients Receiving Chemotherapy  Today you received the following chemotherapy agents cisplatin, bleocin, and etoposide  To help prevent nausea and vomiting after your treatment, we encourage you to take your nausea medication.   If you develop nausea and vomiting that is not controlled by your nausea medication, call the clinic.   BELOW ARE SYMPTOMS THAT SHOULD BE REPORTED IMMEDIATELY:  *FEVER GREATER THAN 100.5 F  *CHILLS WITH OR WITHOUT FEVER  NAUSEA AND VOMITING THAT IS NOT CONTROLLED WITH YOUR NAUSEA MEDICATION  *UNUSUAL SHORTNESS OF BREATH  *UNUSUAL BRUISING OR BLEEDING  TENDERNESS IN MOUTH AND THROAT WITH OR WITHOUT PRESENCE OF ULCERS  *URINARY PROBLEMS  *BOWEL PROBLEMS  UNUSUAL RASH Items with * indicate a potential emergency and should be followed up as soon as possible.  Feel free to call the clinic you have any questions or concerns. The clinic phone number is [phone].  Please show the [place] at check-in to the Emergency Department and triage nurse.

## 2014-09-22 ENCOUNTER — Telehealth: Payer: Self-pay | Admitting: *Deleted

## 2014-09-22 ENCOUNTER — Ambulatory Visit: Payer: Self-pay

## 2014-09-22 NOTE — Telephone Encounter (Signed)
Pt had not arrived to 8am chemo. Called mobile number, phone went straight to voicemail. Called home number listed, male caller answered phone and stated "they are not able to get up there today for chemo because their car's breaks are being worked on". Encouraged caller to have pt call us to work out schedule today and this week. She voiced understanding, did not state her name.

## 2014-09-23 ENCOUNTER — Ambulatory Visit (HOSPITAL_BASED_OUTPATIENT_CLINIC_OR_DEPARTMENT_OTHER): Payer: Self-pay

## 2014-09-23 VITALS — BP 108/63 | HR 67 | Temp 98.3°F | Resp 16

## 2014-09-23 DIAGNOSIS — C6290 Malignant neoplasm of unspecified testis, unspecified whether descended or undescended: Secondary | ICD-10-CM

## 2014-09-23 DIAGNOSIS — C6291 Malignant neoplasm of right testis, unspecified whether descended or undescended: Secondary | ICD-10-CM

## 2014-09-23 DIAGNOSIS — Z5111 Encounter for antineoplastic chemotherapy: Secondary | ICD-10-CM

## 2014-09-23 LAB — AFP TUMOR MARKER: AFP TUMOR MARKER: 2.1 ng/mL (ref <6.1)

## 2014-09-23 LAB — BETA HCG QUANT (REF LAB): BETA HCG, TUMOR MARKER: 5.9 m[IU]/mL — AB (ref <5.0)

## 2014-09-23 MED ORDER — POTASSIUM CHLORIDE 2 MEQ/ML IV SOLN
Freq: Once | INTRAVENOUS | Status: AC
  Administered 2014-09-23: 09:00:00 via INTRAVENOUS
  Filled 2014-09-23: qty 10

## 2014-09-23 MED ORDER — SODIUM CHLORIDE 0.9 % IV SOLN
20.0000 mg/m2 | Freq: Once | INTRAVENOUS | Status: AC
  Administered 2014-09-23: 33 mg via INTRAVENOUS
  Filled 2014-09-23: qty 33

## 2014-09-23 MED ORDER — SODIUM CHLORIDE 0.9 % IV SOLN
100.0000 mg/m2 | Freq: Once | INTRAVENOUS | Status: AC
  Administered 2014-09-23: 170 mg via INTRAVENOUS
  Filled 2014-09-23: qty 8.5

## 2014-09-23 MED ORDER — PALONOSETRON HCL INJECTION 0.25 MG/5ML
0.2500 mg | Freq: Once | INTRAVENOUS | Status: AC
  Administered 2014-09-23: 0.25 mg via INTRAVENOUS

## 2014-09-23 MED ORDER — SODIUM CHLORIDE 0.9 % IV SOLN
20.0000 mg | Freq: Once | INTRAVENOUS | Status: AC
  Administered 2014-09-23: 20 mg via INTRAVENOUS
  Filled 2014-09-23: qty 2

## 2014-09-23 MED ORDER — PALONOSETRON HCL INJECTION 0.25 MG/5ML
INTRAVENOUS | Status: AC
  Filled 2014-09-23: qty 5

## 2014-09-23 MED ORDER — SODIUM CHLORIDE 0.9 % IV SOLN
Freq: Once | INTRAVENOUS | Status: AC
  Administered 2014-09-23: 09:00:00 via INTRAVENOUS

## 2014-09-23 NOTE — Patient Instructions (Signed)
  [facility] Discharge Instructions for Patients Receiving Chemotherapy  Today you received the following chemotherapy agents:  Cisplatin & VP-16  To help prevent nausea and vomiting after your treatment, we encourage you to take your nausea medication. If you develop nausea and vomiting that is not controlled by your nausea medication, call the clinic.   BELOW ARE SYMPTOMS THAT SHOULD BE REPORTED IMMEDIATELY:  *FEVER GREATER THAN 100.5 F  *CHILLS WITH OR WITHOUT FEVER  NAUSEA AND VOMITING THAT IS NOT CONTROLLED WITH YOUR NAUSEA MEDICATION  *UNUSUAL SHORTNESS OF BREATH  *UNUSUAL BRUISING OR BLEEDING  TENDERNESS IN MOUTH AND THROAT WITH OR WITHOUT PRESENCE OF ULCERS  *URINARY PROBLEMS  *BOWEL PROBLEMS  UNUSUAL RASH Items with * indicate a potential emergency and should be followed up as soon as possible.  Feel free to call the clinic you have any questions or concerns. The clinic phone number is [phone].  Please show the [place] at check-in to the Emergency Department and triage nurse.

## 2014-09-24 ENCOUNTER — Ambulatory Visit (HOSPITAL_BASED_OUTPATIENT_CLINIC_OR_DEPARTMENT_OTHER): Payer: Self-pay

## 2014-09-24 ENCOUNTER — Other Ambulatory Visit: Payer: Self-pay | Admitting: *Deleted

## 2014-09-24 VITALS — BP 117/64 | HR 66 | Temp 97.8°F | Resp 18

## 2014-09-24 DIAGNOSIS — Z5111 Encounter for antineoplastic chemotherapy: Secondary | ICD-10-CM

## 2014-09-24 DIAGNOSIS — C6291 Malignant neoplasm of right testis, unspecified whether descended or undescended: Secondary | ICD-10-CM

## 2014-09-24 DIAGNOSIS — C6290 Malignant neoplasm of unspecified testis, unspecified whether descended or undescended: Secondary | ICD-10-CM

## 2014-09-24 MED ORDER — SODIUM CHLORIDE 0.9 % IV SOLN
Freq: Once | INTRAVENOUS | Status: AC
  Administered 2014-09-24: 08:00:00 via INTRAVENOUS

## 2014-09-24 MED ORDER — SODIUM CHLORIDE 0.9 % IV SOLN
20.0000 mg | Freq: Once | INTRAVENOUS | Status: AC
  Administered 2014-09-24: 20 mg via INTRAVENOUS
  Filled 2014-09-24: qty 2

## 2014-09-24 MED ORDER — SODIUM CHLORIDE 0.9 % IV SOLN
20.0000 mg/m2 | Freq: Once | INTRAVENOUS | Status: AC
  Administered 2014-09-24: 33 mg via INTRAVENOUS
  Filled 2014-09-24: qty 33

## 2014-09-24 MED ORDER — POTASSIUM CHLORIDE 2 MEQ/ML IV SOLN
Freq: Once | INTRAVENOUS | Status: AC
  Administered 2014-09-24: 08:00:00 via INTRAVENOUS
  Filled 2014-09-24: qty 10

## 2014-09-24 MED ORDER — SODIUM CHLORIDE 0.9 % IV SOLN
100.0000 mg/m2 | Freq: Once | INTRAVENOUS | Status: AC
  Administered 2014-09-24: 170 mg via INTRAVENOUS
  Filled 2014-09-24: qty 8.5

## 2014-09-24 NOTE — Patient Instructions (Signed)
[facility] Discharge Instructions for Patients Receiving Chemotherapy  Today you received the following chemotherapy agents: Cisplatin, Vepesid.  To help prevent nausea and vomiting after your treatment, we encourage you to take your nausea medication as directed.    If you develop nausea and vomiting that is not controlled by your nausea medication, call the clinic.   BELOW ARE SYMPTOMS THAT SHOULD BE REPORTED IMMEDIATELY:  *FEVER GREATER THAN 100.5 F  *CHILLS WITH OR WITHOUT FEVER  NAUSEA AND VOMITING THAT IS NOT CONTROLLED WITH YOUR NAUSEA MEDICATION  *UNUSUAL SHORTNESS OF BREATH  *UNUSUAL BRUISING OR BLEEDING  TENDERNESS IN MOUTH AND THROAT WITH OR WITHOUT PRESENCE OF ULCERS  *URINARY PROBLEMS  *BOWEL PROBLEMS  UNUSUAL RASH Items with * indicate a potential emergency and should be followed up as soon as possible.  Feel free to call the clinic you have any questions or concerns. The clinic phone number is [phone].  Please show the [place] at check-in to the Emergency Department and triage nurse.

## 2014-09-25 ENCOUNTER — Ambulatory Visit (HOSPITAL_BASED_OUTPATIENT_CLINIC_OR_DEPARTMENT_OTHER): Payer: Self-pay

## 2014-09-25 VITALS — BP 122/47 | HR 76 | Temp 98.2°F

## 2014-09-25 DIAGNOSIS — Z5189 Encounter for other specified aftercare: Secondary | ICD-10-CM

## 2014-09-25 DIAGNOSIS — C6291 Malignant neoplasm of right testis, unspecified whether descended or undescended: Secondary | ICD-10-CM

## 2014-09-25 MED ORDER — PEGFILGRASTIM INJECTION 6 MG/0.6ML ~~LOC~~
6.0000 mg | PREFILLED_SYRINGE | Freq: Once | SUBCUTANEOUS | Status: AC
  Administered 2014-09-25: 6 mg via SUBCUTANEOUS
  Filled 2014-09-25: qty 0.6

## 2014-09-28 ENCOUNTER — Ambulatory Visit (HOSPITAL_BASED_OUTPATIENT_CLINIC_OR_DEPARTMENT_OTHER): Payer: Self-pay

## 2014-09-28 ENCOUNTER — Other Ambulatory Visit (HOSPITAL_BASED_OUTPATIENT_CLINIC_OR_DEPARTMENT_OTHER): Payer: Self-pay

## 2014-09-28 ENCOUNTER — Ambulatory Visit (HOSPITAL_BASED_OUTPATIENT_CLINIC_OR_DEPARTMENT_OTHER): Payer: Self-pay | Admitting: Physician Assistant

## 2014-09-28 ENCOUNTER — Encounter: Payer: Self-pay | Admitting: Physician Assistant

## 2014-09-28 VITALS — BP 104/46 | HR 69 | Temp 98.4°F | Resp 18 | Ht 68.0 in | Wt 138.9 lb

## 2014-09-28 DIAGNOSIS — C6291 Malignant neoplasm of right testis, unspecified whether descended or undescended: Secondary | ICD-10-CM

## 2014-09-28 DIAGNOSIS — C6290 Malignant neoplasm of unspecified testis, unspecified whether descended or undescended: Secondary | ICD-10-CM

## 2014-09-28 DIAGNOSIS — Z5111 Encounter for antineoplastic chemotherapy: Secondary | ICD-10-CM

## 2014-09-28 DIAGNOSIS — G893 Neoplasm related pain (acute) (chronic): Secondary | ICD-10-CM

## 2014-09-28 LAB — CBC WITH DIFFERENTIAL/PLATELET
BASO%: 0.6 % (ref 0.0–2.0)
BASOS ABS: 0.1 10*3/uL (ref 0.0–0.1)
EOS%: 0.2 % (ref 0.0–7.0)
Eosinophils Absolute: 0 10*3/uL (ref 0.0–0.5)
HEMATOCRIT: 39.1 % (ref 38.4–49.9)
HGB: 13.1 g/dL (ref 13.0–17.1)
LYMPH#: 1.4 10*3/uL (ref 0.9–3.3)
LYMPH%: 7.1 % — ABNORMAL LOW (ref 14.0–49.0)
MCH: 31.1 pg (ref 27.2–33.4)
MCHC: 33.6 g/dL (ref 32.0–36.0)
MCV: 92.6 fL (ref 79.3–98.0)
MONO#: 1.5 10*3/uL — ABNORMAL HIGH (ref 0.1–0.9)
MONO%: 7.4 % (ref 0.0–14.0)
NEUT#: 17.1 10*3/uL — ABNORMAL HIGH (ref 1.5–6.5)
NEUT%: 84.7 % — AB (ref 39.0–75.0)
Platelets: 329 10*3/uL (ref 140–400)
RBC: 4.23 10*6/uL (ref 4.20–5.82)
RDW: 13.2 % (ref 11.0–14.6)
WBC: 20.2 10*3/uL — ABNORMAL HIGH (ref 4.0–10.3)

## 2014-09-28 LAB — COMPREHENSIVE METABOLIC PANEL (CC13)
ALT: 20 U/L (ref 0–55)
AST: 15 U/L (ref 5–34)
Albumin: 3.9 g/dL (ref 3.5–5.0)
Alkaline Phosphatase: 104 U/L (ref 40–150)
Anion Gap: 9 mEq/L (ref 3–11)
BUN: 14.8 mg/dL (ref 7.0–26.0)
CHLORIDE: 102 meq/L (ref 98–109)
CO2: 26 meq/L (ref 22–29)
CREATININE: 0.9 mg/dL (ref 0.7–1.3)
Calcium: 9 mg/dL (ref 8.4–10.4)
EGFR: >90 mL/min/{1.73_m2} (ref >90)
GLUCOSE: 107 mg/dL (ref 70–140)
POTASSIUM: 4.1 meq/L (ref 3.5–5.1)
SODIUM: 137 meq/L (ref 136–145)
Total Bilirubin: 0.36 mg/dL (ref 0.20–1.20)
Total Protein: 6.6 g/dL (ref 6.4–8.3)

## 2014-09-28 MED ORDER — BLEOMYCIN SULFATE CHEMO INJECTION 30 UNIT
30.0000 [IU] | Freq: Once | INTRAMUSCULAR | Status: AC
Start: 2014-09-28 — End: 2014-09-28
  Administered 2014-09-28: 30 [IU] via INTRAVENOUS
  Filled 2014-09-28: qty 10

## 2014-09-28 MED ORDER — SODIUM CHLORIDE 0.9 % IV SOLN
Freq: Once | INTRAVENOUS | Status: AC
  Administered 2014-09-28: 14:00:00 via INTRAVENOUS

## 2014-09-28 MED ORDER — PROCHLORPERAZINE MALEATE 10 MG PO TABS
10.0000 mg | ORAL_TABLET | Freq: Once | ORAL | Status: AC
  Administered 2014-09-28: 10 mg via ORAL

## 2014-09-28 MED ORDER — HYDROCODONE-ACETAMINOPHEN 7.5-325 MG PO TABS: 1.0000 | ORAL_TABLET | Freq: Four times a day (QID) | ORAL | Status: DC | PRN

## 2014-09-28 MED ORDER — PROCHLORPERAZINE MALEATE 10 MG PO TABS
ORAL_TABLET | ORAL | Status: AC
  Filled 2014-09-28: qty 1

## 2014-09-28 NOTE — Progress Notes (Signed)
Hematology and Oncology Follow Up Visit  Kelly Garza [id number] Sep 23, 1990 24 y.o. 09/28/2014 4:47 PM No PCP Per PatientNo ref. 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 He had LV I an elevated tumor marker of alpha-fetoprotein is 2038 and beta-hCG of 942. Imaging studies which showed pulmonary nodules suspicious for stage IIIa disease.   Prior Therapy: He is status post orchiectomy done on 04/19/2014.  Current therapy: BEP systemic chemotherapy started on 08/30/2014. He is here for day 16 of cycle 1.  Interim History: Kelly Garza presents today for a follow-up visit accompanied by his mother and his significant other. Since the last visit, he continues to tolerate systemic chemotherapy very well. He did not have any complications related to bleomycin. He did not report any fevers or infusion related complications. He does not have any pulmonary symptoms such as chest pain, shortness of breath or cough. He did develop skin rash related to sun screen which she have changed recently. He does not report any chills or sweats. He does not report any nausea or vomiting. He is able to perform activities of daily living without any decline.  He does not report any headaches, blurry vision, syncope or seizures. He does not report any fevers, chills, sweats or weight loss. He does not report any chest pain, palpitation, orthopnea or PND. He does not report any cough, wheezing, hemoptysis, shortness of breath, dyspnea on exertion or difficulty breathing. He does not report any nausea, vomiting, abdominal pain, constipation, diarrhea, hematochezia or change in his bowel habits. He does not report any frequency, urgency, hesitancy or hematuria. He does not report any skeletal complaints including arthralgias myalgias. He does not  report any total cold intolerance. Does not report any mood disorder clearing anxiety or depression. Remaining review of systems unremarkable  Medications: I have reviewed the patient's current medications.  Current Outpatient Prescriptions  Medication Sig Dispense Refill  . albuterol (PROVENTIL HFA;VENTOLIN HFA) 108 (90 BASE) MCG/ACT inhaler Inhale into the lungs every 6 (six) hours as needed for wheezing or shortness of breath.    Marland Kitchen HYDROcodone-acetaminophen (NORCO) 7.5-325 MG per tablet Take 1 tablet by mouth every 6 (six) hours as needed for moderate pain. 30 tablet 0  . ibuprofen (ADVIL,MOTRIN) 200 MG tablet Take 200 mg by mouth every 6 (six) hours as needed.    Marland Kitchen LORazepam (ATIVAN) 1 MG tablet Take 1 tablet (1 mg total) by mouth at bedtime as needed for anxiety or sleep. 30 tablet 1  . nicotine (NICODERM CQ - DOSED IN MG/24 HR) 7 mg/24hr patch Place 7 mg onto the skin daily.    . ondansetron (ZOFRAN) 8 MG tablet Take 1 tablet (8 mg total) by mouth every 8 (eight) hours as needed for nausea or vomiting. 20 tablet 0  . prochlorperazine (COMPAZINE) 10 MG tablet Take 1 tablet (10 mg total) by mouth every 6 (six) hours as needed for nausea or vomiting. 30 tablet 2  . sulfamethoxazole-trimethoprim (BACTRIM DS,SEPTRA DS) 800-160 MG per tablet Take 1 tablet by mouth 2 (two) times daily. X 14 days 28 tablet 0  . testosterone enanthate (DELATESTRYL) 200 MG/ML injection Inject 200 mg into the muscle every 14 (fourteen) days. For IM use only     No current facility-administered medications for this visit.     Allergies: No Known Allergies  Past Medical  History, Surgical history, Social history, and Family History were reviewed and updated.   Physical Exam: Blood pressure 104/46, pulse 69, temperature 98.4 F (36.9 C), temperature source Oral, resp. rate 18, height 5\' 8"  (1.727 m), weight 138 lb 14.4 oz (63.005 kg), SpO2 97 %. ECOG: 0 General appearance: alert and cooperative Head:  Normocephalic, without obvious abnormality. Slight erythema noted under the right jaw. Neck: no adenopathy, no carotid bruit, no JVD and supple, symmetrical, trachea midline Lymph nodes: Cervical, supraclavicular, and axillary nodes normal. Heart:regular rate and rhythm, S1, S2 normal, no murmur, click, rub or gallop Lung:chest clear, no wheezing, rales, normal symmetric air entry Abdomin: soft, non-tender, without masses or organomegaly EXT:no erythema, induration, or nodules Skin: Eruptions noted in the back and shoulders without any erythema or induration.  Lab Results: Lab Results  Component Value Date   WBC 20.2* 09/28/2014   HGB 13.1 09/28/2014   HCT 39.1 09/28/2014   MCV 92.6 09/28/2014   PLT 329 09/28/2014     Chemistry      Component Value Date/Time   NA 137 09/28/2014 1234   K 4.1 09/28/2014 1234   CO2 26 09/28/2014 1234   BUN 14.8 09/28/2014 1234   CREATININE 0.9 09/28/2014 1234      Component Value Date/Time   CALCIUM 9.0 09/28/2014 1234   ALKPHOS 104 09/28/2014 1234   AST 15 09/28/2014 1234   ALT 20 09/28/2014 1234   BILITOT 0.36 09/28/2014 1234       Radiological Studies: CLINICAL DATA: Subsequent evaluation of a 24 year old male with history of 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 Evaluate for metastatic disease.  EXAM: CT CHEST, ABDOMEN, AND PELVIS WITH CONTRAST  TECHNIQUE: Multidetector CT imaging of the chest, abdomen and pelvis was performed following the standard protocol during bolus administration of intravenous contrast.  CONTRAST: 117mL OMNIPAQUE IOHEXOL 300 MG/ML SOLN  COMPARISON: No priors.  FINDINGS: CT CHEST FINDINGS  Mediastinum/Lymph Nodes: Heart size is normal. There is no significant pericardial fluid, thickening or pericardial calcification. Borderline enlarged right hilar lymph node measuring 9 mm in short axis is nonspecific. Mildly enlarged retrocrural lymph nodes measuring up  to 1 cm in short axis on the right. Esophagus is unremarkable in appearance. No axillary lymphadenopathy.  Lungs/Pleura: Paraseptal emphysema is noted in the lungs bilaterally (left greater than right), generally mild, with exception of the lung bases. In particular, in the left lung base there are large subpleural bullae. Along the posterior margin of the largest bulla in the wall inferior left hemithorax associated with the inferior aspect of left lower lobe there is a macrolobulated 12 x 6 mm nodule. A few other scattered tiny 2-3 mm pulmonary nodules are also noted, predominantly in a subpleural distribution, nonspecific and favored to represent subpleural lymph nodes, largest of which measures 3 mm (image 18 of series 4) in the posterior aspect of the left upper lobe. No acute consolidative airspace disease. No pleural effusions.  Musculoskeletal/Soft Tissues: There are no aggressive appearing lytic or blastic lesions noted in the visualized portions of the skeleton.  CT ABDOMEN AND PELVIS FINDINGS  Hepatobiliary: No cystic or solid hepatic lesions. No intra or extrahepatic biliary ductal dilatation. Gallbladder is normal in appearance.  Pancreas: No pancreatic mass. No pancreatic ductal dilatation. No pancreatic or peripancreatic fluid or inflammatory changes.  Spleen: Small splenule adjacent to the splenic hilum. Otherwise, unremarkable.  Adrenals/Urinary Tract: Left kidney and left adrenal gland are not visualized, and could be surgically absent (although there  are no surgical clips or other surgical footprint in the left retroperitoneum) or congenitally absent (favored). Right adrenal gland and right kidney are normal in appearance. No hydroureteronephrosis. Urinary bladder is normal in appearance.  Stomach/Bowel: Normal appearance of the stomach. No pathologic dilatation of small bowel or colon.  Vascular/Lymphatic: No significant atherosclerotic disease,  aneurysm or dissection identified in the abdominal or pelvic vasculature. Normal left renal artery origin not visualized. Borderline enlarged left inguinal and common femoral lymph nodes noted, measuring up to 9 mm in short axis. No other definite lymphadenopathy noted in the abdomen or pelvis.  Reproductive: Prostate gland and seminal vesicles are unremarkable in appearance. Postoperative changes of left orchiectomy incompletely visualized.  Other: No significant volume of ascites. No pneumoperitoneum.  Musculoskeletal: There are no aggressive appearing lytic or blastic lesions noted in the visualized portions of the skeleton.  IMPRESSION: 1. Prominent borderline enlarged left inguinal and left common femoral lymph nodes, as well as mildly enlarged retrocrural lymph nodes, and a borderline enlarged right hilar lymph node. These findings are nonspecific, but metastatic disease is not excluded. Further evaluation with PET-CT is suggested at this time. 2. In addition, there is a 1.2 x 0.6 cm macrolobulated pulmonary nodule in the inferior aspect of the left lower lobe. This is highly nonspecific, and is favored to represent an area of chronically scarred lung parenchyma adjacent to the large bullae in the base of the left lower lobe. However, attention at time of follow-up PET-CT is suggested to exclude hypermetabolism, as an isolated metastatic lesion is not excluded. Other tiny pulmonary nodules in lungs bilaterally are favored to represent subpleural lymph nodes but warrant continued attention on future follow-up imaging. 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 4. Unusual appearance of the left lung, with generalized mild paraseptal emphysema, which is far more pronounced at the left lung base where there are several large bullae. 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 Pulmonology referral could be considered for further evaluation.  Impression and Plan:   24 year old gentleman with the following issues:  1. 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 The pathology showed a focal LVI . His tumor markers were elevated with alpha-fetoprotein of 2038 and a beta-hCG of 942. Alpha-fetoprotein normalized postoperatively and his beta-hCG after a nadir of 18 was climbing up to 26.  Imaging studies including a CT scan on 08/25/2014 was reviewed and indicated  pulmonary nodules that could be suspicious for malignancy. His staging including stage IS versus stage IIIa.  He is currently receiving BEP systemic chemotherapy without any major complications. He is ready to proceed with day 16 of cycle 1. He will return to start cycle 2 on 09/20/2014. The plan is to proceed with 3 cycles.  The goal of systemic chemotherapy is curative at this time.  2. Pulmonary toxicity prophylaxis: His pulmonary function tests showed a normal DLCO. I continue to question him about respiratory symptoms associated with the bleomycin. I also educated him about smoking cessation. He reports that he does not smoked since the start of chemotherapy.  3. Neutropenia prophylaxis:Neulasta will be added to his chemotherapy regimen if his neutropenia persists at  the beginning of cycle 2.  4. IV access: He declined a Port-A-Cath and currently using the peripheral veins.  5. Antiemetics: Prescription for Zofran and Compazine available to the patient and seems to be working at this time.  6. Fertility issues: He has underwent sperm banking prior to his orchiectomy in March 2016.  7. Neck abscess: resolved.  8. Follow-up: With day 16, cycle 2  on 10/05/2014.     Carlton Adam, [place]  8/16/20164:47 PM

## 2014-09-28 NOTE — Patient Instructions (Signed)
[facility] Discharge Instructions for Patients Receiving Chemotherapy  Today you received the following chemotherapy agents :  Bleomycin.  To help prevent nausea and vomiting after your treatment, we encourage you to take your nausea medication as prescribed.   If you develop nausea and vomiting that is not controlled by your nausea medication, call the clinic.   BELOW ARE SYMPTOMS THAT SHOULD BE REPORTED IMMEDIATELY:  *FEVER GREATER THAN 100.5 F  *CHILLS WITH OR WITHOUT FEVER  NAUSEA AND VOMITING THAT IS NOT CONTROLLED WITH YOUR NAUSEA MEDICATION  *UNUSUAL SHORTNESS OF BREATH  *UNUSUAL BRUISING OR BLEEDING  TENDERNESS IN MOUTH AND THROAT WITH OR WITHOUT PRESENCE OF ULCERS  *URINARY PROBLEMS  *BOWEL PROBLEMS  UNUSUAL RASH Items with * indicate a potential emergency and should be followed up as soon as possible.  Feel free to call the clinic you have any questions or concerns. The clinic phone number is [phone].  Please show the CHEMO ALERT CARD at check-in to the Emergency Department and triage nurse.   

## 2014-10-01 NOTE — Patient Instructions (Signed)
Continue labs and chemotherapy as scheduled Follow-up in one week 

## 2014-10-05 ENCOUNTER — Ambulatory Visit (HOSPITAL_BASED_OUTPATIENT_CLINIC_OR_DEPARTMENT_OTHER): Payer: Self-pay

## 2014-10-05 ENCOUNTER — Ambulatory Visit (HOSPITAL_BASED_OUTPATIENT_CLINIC_OR_DEPARTMENT_OTHER): Payer: Self-pay | Admitting: Oncology

## 2014-10-05 ENCOUNTER — Other Ambulatory Visit (HOSPITAL_BASED_OUTPATIENT_CLINIC_OR_DEPARTMENT_OTHER): Payer: Self-pay

## 2014-10-05 ENCOUNTER — Telehealth: Payer: Self-pay | Admitting: Oncology

## 2014-10-05 VITALS — BP 113/63 | HR 54 | Temp 98.5°F | Resp 18 | Ht 68.0 in | Wt 136.1 lb

## 2014-10-05 VITALS — BP 120/66 | HR 58 | Temp 97.6°F | Resp 17

## 2014-10-05 DIAGNOSIS — C6291 Malignant neoplasm of right testis, unspecified whether descended or undescended: Secondary | ICD-10-CM

## 2014-10-05 DIAGNOSIS — C6290 Malignant neoplasm of unspecified testis, unspecified whether descended or undescended: Secondary | ICD-10-CM

## 2014-10-05 DIAGNOSIS — Z5111 Encounter for antineoplastic chemotherapy: Secondary | ICD-10-CM

## 2014-10-05 DIAGNOSIS — R110 Nausea: Secondary | ICD-10-CM

## 2014-10-05 DIAGNOSIS — G893 Neoplasm related pain (acute) (chronic): Secondary | ICD-10-CM

## 2014-10-05 DIAGNOSIS — G4700 Insomnia, unspecified: Secondary | ICD-10-CM

## 2014-10-05 LAB — CBC WITH DIFFERENTIAL/PLATELET
BASO%: 0.9 % (ref 0.0–2.0)
BASOS ABS: 0.1 10*3/uL (ref 0.0–0.1)
EOS ABS: 0 10*3/uL (ref 0.0–0.5)
EOS%: 0.3 % (ref 0.0–7.0)
HCT: 44.5 % (ref 38.4–49.9)
HGB: 15 g/dL (ref 13.0–17.1)
LYMPH%: 13.9 % — AB (ref 14.0–49.0)
MCH: 31 pg (ref 27.2–33.4)
MCHC: 33.7 g/dL (ref 32.0–36.0)
MCV: 92.2 fL (ref 79.3–98.0)
MONO#: 0.6 10*3/uL (ref 0.1–0.9)
MONO%: 6.7 % (ref 0.0–14.0)
NEUT%: 78.2 % — AB (ref 39.0–75.0)
NEUTROS ABS: 6.9 10*3/uL — AB (ref 1.5–6.5)
PLATELETS: 154 10*3/uL (ref 140–400)
RBC: 4.83 10*6/uL (ref 4.20–5.82)
RDW: 13.8 % (ref 11.0–14.6)
WBC: 8.8 10*3/uL (ref 4.0–10.3)
lymph#: 1.2 10*3/uL (ref 0.9–3.3)

## 2014-10-05 LAB — COMPREHENSIVE METABOLIC PANEL (CC13)
ALK PHOS: 97 U/L (ref 40–150)
ALT: 18 U/L (ref 0–55)
ANION GAP: 10 meq/L (ref 3–11)
AST: 15 U/L (ref 5–34)
Albumin: 4.1 g/dL (ref 3.5–5.0)
BILIRUBIN TOTAL: 0.2 mg/dL (ref 0.20–1.20)
BUN: 11.7 mg/dL (ref 7.0–26.0)
CO2: 21 meq/L — AB (ref 22–29)
Calcium: 9.7 mg/dL (ref 8.4–10.4)
Chloride: 109 mEq/L (ref 98–109)
Creatinine: 0.9 mg/dL (ref 0.7–1.3)
Glucose: 94 mg/dl (ref 70–140)
POTASSIUM: 4.5 meq/L (ref 3.5–5.1)
Sodium: 141 mEq/L (ref 136–145)
TOTAL PROTEIN: 7 g/dL (ref 6.4–8.3)

## 2014-10-05 MED ORDER — SODIUM CHLORIDE 0.9 % IV SOLN
Freq: Once | INTRAVENOUS | Status: AC
  Administered 2014-10-05: 10:00:00 via INTRAVENOUS

## 2014-10-05 MED ORDER — OMEPRAZOLE 20 MG PO CPDR
20.0000 mg | DELAYED_RELEASE_CAPSULE | Freq: Every day | ORAL | Status: DC
Start: 2014-10-05 — End: 2015-01-18

## 2014-10-05 MED ORDER — PROCHLORPERAZINE MALEATE 10 MG PO TABS
10.0000 mg | ORAL_TABLET | Freq: Once | ORAL | Status: AC
  Administered 2014-10-05: 10 mg via ORAL

## 2014-10-05 MED ORDER — LORAZEPAM 1 MG PO TABS: 1.0000 mg | ORAL_TABLET | Freq: Every evening | ORAL | Status: DC | PRN

## 2014-10-05 MED ORDER — HYDROCODONE-ACETAMINOPHEN 7.5-325 MG PO TABS: 1.0000 | ORAL_TABLET | Freq: Four times a day (QID) | ORAL | Status: DC | PRN

## 2014-10-05 MED ORDER — PROCHLORPERAZINE MALEATE 10 MG PO TABS
ORAL_TABLET | ORAL | Status: AC
  Filled 2014-10-05: qty 1

## 2014-10-05 MED ORDER — SODIUM CHLORIDE 0.9 % IV SOLN
30.0000 [IU] | Freq: Once | INTRAVENOUS | Status: AC
  Administered 2014-10-05: 30 [IU] via INTRAVENOUS
  Filled 2014-10-05: qty 10

## 2014-10-05 NOTE — Progress Notes (Signed)
Hematology and Oncology Follow Up Visit  Kelly Garza [id number] 1990/03/23 24 y.o. 10/05/2014 8:47 AM No PCP Per PatientNo ref. 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 He had LV I an elevated tumor marker of alpha-fetoprotein is 2038 and beta-hCG of 942. Imaging studies which showed pulmonary nodules suspicious for stage IIIa disease.   Prior Therapy: He is status post orchiectomy done on 04/19/2014.  Current therapy: BEP systemic chemotherapy started on 08/30/2014. He is here for day 16 of cycle 2.  Interim History: Kelly Garza presents today for a follow-up visit accompanied by his mother. Since the last visit, he continues to tolerate systemic chemotherapy very well. He did not have any complications related to bleomycin. He did not report any fevers or infusion related complications. He does not have any pulmonary symptoms such as chest pain, shortness of breath or cough.  He is able to perform activities of daily living without any decline. He continues to report to diffuse arthralgias and myalgias related to chemotherapy. He takes hydrocodone which have helped his symptoms.  He does not report any headaches, blurry vision, syncope or seizures. He does not report any fevers, chills, sweats or weight loss. He does not report any chest pain, palpitation, orthopnea or PND. He does not report any cough, wheezing, hemoptysis, shortness of breath, dyspnea on exertion or difficulty breathing. He does not report any nausea, vomiting, abdominal pain, constipation, diarrhea, hematochezia or change in his bowel habits. He does not report any frequency, urgency, hesitancy or hematuria. He does not report any skeletal complaints including arthralgias myalgias. He does not report any total cold intolerance. Does not report any  mood disorder clearing anxiety or depression. Remaining review of systems unremarkable  Medications: I have reviewed the patient's current medications.  Current Outpatient Prescriptions  Medication Sig Dispense Refill  . albuterol (PROVENTIL HFA;VENTOLIN HFA) 108 (90 BASE) MCG/ACT inhaler Inhale into the lungs every 6 (six) hours as needed for wheezing or shortness of breath.    Marland Kitchen HYDROcodone-acetaminophen (NORCO) 7.5-325 MG per tablet Take 1 tablet by mouth every 6 (six) hours as needed for moderate pain. 30 tablet 0  . ibuprofen (ADVIL,MOTRIN) 200 MG tablet Take 200 mg by mouth every 6 (six) hours as needed.    Marland Kitchen LORazepam (ATIVAN) 1 MG tablet Take 1 tablet (1 mg total) by mouth at bedtime as needed for anxiety or sleep. 30 tablet 0  . nicotine (NICODERM CQ - DOSED IN MG/24 HR) 7 mg/24hr patch Place 7 mg onto the skin daily.    Marland Kitchen omeprazole (PRILOSEC) 20 MG capsule Take 1 capsule (20 mg total) by mouth daily. 30 capsule 1  . ondansetron (ZOFRAN) 8 MG tablet Take 1 tablet (8 mg total) by mouth every 8 (eight) hours as needed for nausea or vomiting. 20 tablet 0  . prochlorperazine (COMPAZINE) 10 MG tablet Take 1 tablet (10 mg total) by mouth every 6 (six) hours as needed for nausea or vomiting. 30 tablet 2  . sulfamethoxazole-trimethoprim (BACTRIM DS,SEPTRA DS) 800-160 MG per tablet Take 1 tablet by mouth 2 (two) times daily. X 14 days 28 tablet 0  . testosterone enanthate (DELATESTRYL) 200 MG/ML injection Inject 200 mg into the muscle every 14 (fourteen) days. For IM use only     No current facility-administered medications for this visit.     Allergies:  No Known Allergies  Past Medical History, Surgical history, Social history, and Family History were reviewed and updated.   Physical Exam: Blood pressure 113/63, pulse 54, temperature 98.5 F (36.9 C), temperature source Oral, resp. rate 18, height 5\' 8"  (1.727 m), weight 136 lb 1.6 oz (61.735 kg), SpO2 100 %. ECOG: 0 General  appearance: alert and cooperative appeared in no active distress. Head: Normocephalic, without obvious abnormality. No erythema or induration noted at the angle of the jaw. Neck: no adenopathy, no carotid bruit, no JVD and supple, symmetrical, trachea midline Lymph nodes: Cervical, supraclavicular, and axillary nodes normal. Heart:regular rate and rhythm, S1, S2 normal, no murmur, click, rub or gallop Lung:chest clear, no wheezing, rales, normal symmetric air entry Abdomin: soft, non-tender, without masses or organomegaly EXT:no erythema, induration, or nodules Skin: Clear without any rashes or lesions.  Lab Results: Lab Results  Component Value Date   WBC 8.8 10/05/2014   HGB 15.0 10/05/2014   HCT 44.5 10/05/2014   MCV 92.2 10/05/2014   PLT 154 10/05/2014     Chemistry      Component Value Date/Time   NA 137 09/28/2014 1234   K 4.1 09/28/2014 1234   CO2 26 09/28/2014 1234   BUN 14.8 09/28/2014 1234   CREATININE 0.9 09/28/2014 1234      Component Value Date/Time   CALCIUM 9.0 09/28/2014 1234   ALKPHOS 104 09/28/2014 1234   AST 15 09/28/2014 1234   ALT 20 09/28/2014 1234   BILITOT 0.36 09/28/2014 1234        Results for Kelly, Garza (MRN [id number]) as of 10/05/2014 08:20  Ref. Range 08/24/2014 09:52 09/20/2014 08:44  AFP Tumor Marker Latest Ref Range: <6.1 ng/mL 2.8 2.1  Beta hCG, Tumor Marker Latest Ref Range: <5.0 mIU/mL 412.4 (H) 5.9 (H)      Impression and Plan:   24 year old gentleman with the following issues:  1. 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 The pathology showed a focal LVI . His tumor markers were elevated with alpha-fetoprotein of 2038 and a beta-hCG of 942. Alpha-fetoprotein normalized postoperatively and his beta-hCG after a nadir of 18 was climbing up to 412.  Imaging  studies including a CT scan on 08/25/2014 indicated  pulmonary nodules that could be suspicious for malignancy. His staging including stage IS versus stage IIIa.  He is currently receiving BEP systemic chemotherapy without any major complications. He is ready to proceed with day 16 of cycle 2. No major complications related to systemic chemotherapy noted. His tumor markers after 1 cycle of therapy responded nicely with his beta-hCG nearly normalizing. Cycle 3 which is the final cycle will start on 10/11/2014. I plan on repeat imaging studies after that to make sure that he has achieved complete response  The goal of systemic chemotherapy is curative at this time.  2. Pulmonary toxicity prophylaxis: His pulmonary function tests showed a normal DLCO. I continue to question him about respiratory symptoms associated with the bleomycin. I also educated him about smoking cessation. He reports that he does not smoked since the start of chemotherapy. He has no respiratory symptoms or any suggestions of pulmonary toxicity.  3. Neutropenia prophylaxis:Neulasta will be added to his chemotherapy regimen if his neutropenia persists at the beginning of cycle 2.  4. IV access: He declined a Port-A-Cath and currently  using the peripheral veins.  5. Antiemetics: Prescription for Zofran and Compazine available to the patient and seems to be working at this time.  6. Fertility issues: He has underwent sperm banking prior to his orchiectomy in March 2016.  7. Neck abscess: resolved.  8. Pain: Predominantly arthralgia and myalgia and he is hydrocodone with excellent response. I have refilled his medication today.  9. Insomnia: Uses Ativan to help with nausea and insomnia which have helped symptoms. I have refilled his Ativan today.  10. Follow-up: Will be on 10/11/2014 for the start of cycle 3 of BEP chemotherapy.     [facility], MD 8/23/20168:47 AM

## 2014-10-05 NOTE — Patient Instructions (Signed)
[place] Discharge Instructions for Patients Receiving Chemotherapy  Today you received the following chemotherapy agents: Bleocin  To help prevent nausea and vomiting after your treatment, we encourage you to take your nausea medication as prescribed by your physician.   If you develop nausea and vomiting that is not controlled by your nausea medication, call the clinic.   BELOW ARE SYMPTOMS THAT SHOULD BE REPORTED IMMEDIATELY:  *FEVER GREATER THAN 100.5 F  *CHILLS WITH OR WITHOUT FEVER  NAUSEA AND VOMITING THAT IS NOT CONTROLLED WITH YOUR NAUSEA MEDICATION  *UNUSUAL SHORTNESS OF BREATH  *UNUSUAL BRUISING OR BLEEDING  TENDERNESS IN MOUTH AND THROAT WITH OR WITHOUT PRESENCE OF ULCERS  *URINARY PROBLEMS  *BOWEL PROBLEMS  UNUSUAL RASH Items with * indicate a potential emergency and should be followed up as soon as possible.  Feel free to call the clinic you have any questions or concerns. The clinic phone number is [phone].  Please show the [facility] at check-in to the Emergency Department and triage nurse.

## 2014-10-05 NOTE — Telephone Encounter (Signed)
Gave and printed appt sched and avs fo rpt for Aug and Sept °

## 2014-10-11 ENCOUNTER — Other Ambulatory Visit (HOSPITAL_BASED_OUTPATIENT_CLINIC_OR_DEPARTMENT_OTHER): Payer: Self-pay

## 2014-10-11 ENCOUNTER — Ambulatory Visit (HOSPITAL_BASED_OUTPATIENT_CLINIC_OR_DEPARTMENT_OTHER): Payer: Self-pay

## 2014-10-11 VITALS — BP 106/57 | HR 63 | Temp 97.8°F | Resp 18

## 2014-10-11 DIAGNOSIS — Z5111 Encounter for antineoplastic chemotherapy: Secondary | ICD-10-CM

## 2014-10-11 DIAGNOSIS — C6290 Malignant neoplasm of unspecified testis, unspecified whether descended or undescended: Secondary | ICD-10-CM

## 2014-10-11 DIAGNOSIS — C6291 Malignant neoplasm of right testis, unspecified whether descended or undescended: Secondary | ICD-10-CM

## 2014-10-11 LAB — COMPREHENSIVE METABOLIC PANEL (CC13)
ALT: 14 U/L (ref 0–55)
AST: 14 U/L (ref 5–34)
Albumin: 4 g/dL (ref 3.5–5.0)
Alkaline Phosphatase: 83 U/L (ref 40–150)
Anion Gap: 8 mEq/L (ref 3–11)
BUN: 12.6 mg/dL (ref 7.0–26.0)
CALCIUM: 9.4 mg/dL (ref 8.4–10.4)
CHLORIDE: 106 meq/L (ref 98–109)
CO2: 25 meq/L (ref 22–29)
CREATININE: 0.9 mg/dL (ref 0.7–1.3)
EGFR: >90 mL/min/{1.73_m2} (ref >90)
GLUCOSE: 98 mg/dL (ref 70–140)
Potassium: 4.5 mEq/L (ref 3.5–5.1)
SODIUM: 139 meq/L (ref 136–145)
Total Bilirubin: 0.27 mg/dL (ref 0.20–1.20)
Total Protein: 6.8 g/dL (ref 6.4–8.3)

## 2014-10-11 LAB — CBC WITH DIFFERENTIAL/PLATELET
BASO%: 0.7 % (ref 0.0–2.0)
Basophils Absolute: 0.1 10*3/uL (ref 0.0–0.1)
EOS%: 0.6 % (ref 0.0–7.0)
Eosinophils Absolute: 0.1 10*3/uL (ref 0.0–0.5)
HCT: 40.4 % (ref 38.4–49.9)
HGB: 13.8 g/dL (ref 13.0–17.1)
LYMPH%: 14.4 % (ref 14.0–49.0)
MCH: 31 pg (ref 27.2–33.4)
MCHC: 34.2 g/dL (ref 32.0–36.0)
MCV: 90.8 fL (ref 79.3–98.0)
MONO#: 0.9 10*3/uL (ref 0.1–0.9)
MONO%: 11.3 % (ref 0.0–14.0)
NEUT%: 73 % (ref 39.0–75.0)
NEUTROS ABS: 5.9 10*3/uL (ref 1.5–6.5)
PLATELETS: 191 10*3/uL (ref 140–400)
RBC: 4.45 10*6/uL (ref 4.20–5.82)
RDW: 13.5 % (ref 11.0–14.6)
WBC: 8 10*3/uL (ref 4.0–10.3)
lymph#: 1.2 10*3/uL (ref 0.9–3.3)

## 2014-10-11 LAB — LACTATE DEHYDROGENASE (CC13): LDH: 188 U/L (ref 125–245)

## 2014-10-11 MED ORDER — SODIUM CHLORIDE 0.9 % IV SOLN
Freq: Once | INTRAVENOUS | Status: AC
  Administered 2014-10-11: 10:00:00 via INTRAVENOUS

## 2014-10-11 MED ORDER — POTASSIUM CHLORIDE 2 MEQ/ML IV SOLN
Freq: Once | INTRAVENOUS | Status: AC
  Administered 2014-10-11: 10:00:00 via INTRAVENOUS
  Filled 2014-10-11: qty 10

## 2014-10-11 MED ORDER — SODIUM CHLORIDE 0.9 % IV SOLN
20.0000 mg/m2 | Freq: Once | INTRAVENOUS | Status: AC
  Administered 2014-10-11: 33 mg via INTRAVENOUS
  Filled 2014-10-11: qty 33

## 2014-10-11 MED ORDER — SODIUM CHLORIDE 0.9 % IV SOLN
100.0000 mg/m2 | Freq: Once | INTRAVENOUS | Status: AC
  Administered 2014-10-11: 170 mg via INTRAVENOUS
  Filled 2014-10-11: qty 8.5

## 2014-10-11 MED ORDER — SODIUM CHLORIDE 0.9 % IV SOLN
Freq: Once | INTRAVENOUS | Status: AC
  Administered 2014-10-11: 13:00:00 via INTRAVENOUS
  Filled 2014-10-11: qty 5

## 2014-10-11 MED ORDER — PALONOSETRON HCL INJECTION 0.25 MG/5ML
0.2500 mg | Freq: Once | INTRAVENOUS | Status: AC
  Administered 2014-10-11: 0.25 mg via INTRAVENOUS

## 2014-10-11 MED ORDER — PALONOSETRON HCL INJECTION 0.25 MG/5ML
INTRAVENOUS | Status: AC
  Filled 2014-10-11: qty 5

## 2014-10-11 NOTE — Patient Instructions (Signed)
[facility] Inst[facility] for Patients Receiving Chemotherapy  Today you received the following chemotherapy agents Cisplatin/Etoposide.  To help prevent nausea and vomiting after your treatment, we encourage you to take your nausea medication as directed.   If you develop nausea and vomiting that is not controlled by your nausea medication, call the clinic.   BELOW ARE SYMPTOMS THAT SHOULD BE REPORTED IMMEDIATELY:  *FEVER GREATER THAN 100.5 F  *CHILLS WITH OR WITHOUT FEVER  NAUSEA AND VOMITING THAT IS NOT CONTROLLED WITH YOUR NAUSEA MEDICATION  *UNUSUAL SHORTNESS OF BREATH  *UNUSUAL BRUISING OR BLEEDING  TENDERNESS IN MOUTH AND THROAT WITH OR WITHOUT PRESENCE OF ULCERS  *URINARY PROBLEMS  *BOWEL PROBLEMS  UNUSUAL RASH Items with * indicate a potential emergency and should be followed up as soon as possible.  Feel free to call the clinic you have any questions or concerns. The clinic phone number is [phone].  Please show the CHEMO ALERT CARD at check-in to the Emergency Department and triage nurse.    

## 2014-10-12 ENCOUNTER — Ambulatory Visit (HOSPITAL_BASED_OUTPATIENT_CLINIC_OR_DEPARTMENT_OTHER): Payer: Self-pay

## 2014-10-12 ENCOUNTER — Other Ambulatory Visit: Payer: Self-pay | Admitting: *Deleted

## 2014-10-12 VITALS — BP 122/62 | HR 67 | Temp 97.7°F | Resp 18

## 2014-10-12 DIAGNOSIS — Z5111 Encounter for antineoplastic chemotherapy: Secondary | ICD-10-CM

## 2014-10-12 DIAGNOSIS — C6291 Malignant neoplasm of right testis, unspecified whether descended or undescended: Secondary | ICD-10-CM

## 2014-10-12 DIAGNOSIS — C6290 Malignant neoplasm of unspecified testis, unspecified whether descended or undescended: Secondary | ICD-10-CM

## 2014-10-12 DIAGNOSIS — G893 Neoplasm related pain (acute) (chronic): Secondary | ICD-10-CM

## 2014-10-12 LAB — AFP TUMOR MARKER: AFP TUMOR MARKER: 1.8 ng/mL (ref <6.1)

## 2014-10-12 LAB — BETA HCG QUANT (REF LAB): BETA HCG, TUMOR MARKER: 2.8 m[IU]/mL (ref <5.0)

## 2014-10-12 MED ORDER — SODIUM CHLORIDE 0.9 % IV SOLN
20.0000 mg/m2 | Freq: Once | INTRAVENOUS | Status: AC
  Administered 2014-10-12: 33 mg via INTRAVENOUS
  Filled 2014-10-12: qty 33

## 2014-10-12 MED ORDER — SODIUM CHLORIDE 0.9 % IV SOLN
30.0000 [IU] | Freq: Once | INTRAVENOUS | Status: AC
  Administered 2014-10-12: 30 [IU] via INTRAVENOUS
  Filled 2014-10-12: qty 10

## 2014-10-12 MED ORDER — SODIUM CHLORIDE 0.9 % IV SOLN
100.0000 mg/m2 | Freq: Once | INTRAVENOUS | Status: AC
  Administered 2014-10-12: 170 mg via INTRAVENOUS
  Filled 2014-10-12: qty 8.5

## 2014-10-12 MED ORDER — HYDROCODONE-ACETAMINOPHEN 7.5-325 MG PO TABS: 1.0000 | ORAL_TABLET | Freq: Four times a day (QID) | ORAL | Status: DC | PRN

## 2014-10-12 MED ORDER — SODIUM CHLORIDE 0.9 % IV SOLN
Freq: Once | INTRAVENOUS | Status: AC
  Administered 2014-10-12: 08:00:00 via INTRAVENOUS

## 2014-10-12 MED ORDER — POTASSIUM CHLORIDE 2 MEQ/ML IV SOLN
Freq: Once | INTRAVENOUS | Status: AC
  Administered 2014-10-12: 09:00:00 via INTRAVENOUS
  Filled 2014-10-12: qty 10

## 2014-10-12 MED ORDER — SODIUM CHLORIDE 0.9 % IV SOLN
20.0000 mg | Freq: Once | INTRAVENOUS | Status: AC
  Administered 2014-10-12: 20 mg via INTRAVENOUS
  Filled 2014-10-12: qty 2

## 2014-10-12 NOTE — Patient Instructions (Signed)
Kelly Garza Discharge Instructions for Patients Receiving Chemotherapy  Today you received the following chemotherapy agents: Cisplatin, Vepesid.  To help prevent nausea and vomiting after your treatment, we encourage you to take your nausea medication as directed.    If you develop nausea and vomiting that is not controlled by your nausea medication, call the clinic.   BELOW ARE SYMPTOMS THAT SHOULD BE REPORTED IMMEDIATELY:  *FEVER GREATER THAN 100.5 F  *CHILLS WITH OR WITHOUT FEVER  NAUSEA AND VOMITING THAT IS NOT CONTROLLED WITH YOUR NAUSEA MEDICATION  *UNUSUAL SHORTNESS OF BREATH  *UNUSUAL BRUISING OR BLEEDING  TENDERNESS IN MOUTH AND THROAT WITH OR WITHOUT PRESENCE OF ULCERS  *URINARY PROBLEMS  *BOWEL PROBLEMS  UNUSUAL RASH Items with * indicate a potential emergency and should be followed up as soon as possible.  Feel free to call the clinic you have any questions or concerns. The clinic phone number is [phone].  Please show the Kelly Garza at check-in to the Emergency Department and triage nurse.  Kelly Garza Discharge Instructions for Patients Receiving Chemotherapy  Today you received the following chemotherapy agents: Bleocin  To help prevent nausea and vomiting after your treatment, we encourage you to take your nausea medication as prescribed by your physician.   If you develop nausea and vomiting that is not controlled by your nausea medication, call the clinic.   BELOW ARE SYMPTOMS THAT SHOULD BE REPORTED IMMEDIATELY:  *FEVER GREATER THAN 100.5 F  *CHILLS WITH OR WITHOUT FEVER  NAUSEA AND VOMITING THAT IS NOT CONTROLLED WITH YOUR NAUSEA MEDICATION  *UNUSUAL SHORTNESS OF BREATH  *UNUSUAL BRUISING OR BLEEDING  TENDERNESS IN MOUTH AND THROAT WITH OR WITHOUT PRESENCE OF ULCERS  *URINARY PROBLEMS  *BOWEL PROBLEMS  UNUSUAL RASH Items with * indicate a potential emergency and should be followed up as soon as  possible.  Feel free to call the clinic you have any questions or concerns. The clinic phone number is [phone].  Please show the Kelly Garza at check-in to the Emergency Department and triage nurse.

## 2014-10-12 NOTE — Telephone Encounter (Signed)
Patient stated he needs more norco, states he sometimes takes 2 tablets, for the discomfort in his hands. Per dr Alen Blew, new script given with instructions reading 1-2 tablets every 6 hours prn pain. # 30.

## 2014-10-13 ENCOUNTER — Ambulatory Visit (HOSPITAL_BASED_OUTPATIENT_CLINIC_OR_DEPARTMENT_OTHER): Payer: Self-pay

## 2014-10-13 VITALS — BP 114/53 | HR 78 | Temp 97.7°F | Resp 16

## 2014-10-13 DIAGNOSIS — Z5111 Encounter for antineoplastic chemotherapy: Secondary | ICD-10-CM

## 2014-10-13 DIAGNOSIS — C6290 Malignant neoplasm of unspecified testis, unspecified whether descended or undescended: Secondary | ICD-10-CM

## 2014-10-13 DIAGNOSIS — C6291 Malignant neoplasm of right testis, unspecified whether descended or undescended: Secondary | ICD-10-CM

## 2014-10-13 MED ORDER — PALONOSETRON HCL INJECTION 0.25 MG/5ML
0.2500 mg | Freq: Once | INTRAVENOUS | Status: AC
  Administered 2014-10-13: 0.25 mg via INTRAVENOUS

## 2014-10-13 MED ORDER — POTASSIUM CHLORIDE 2 MEQ/ML IV SOLN
Freq: Once | INTRAVENOUS | Status: AC
  Administered 2014-10-13: 08:00:00 via INTRAVENOUS
  Filled 2014-10-13: qty 10

## 2014-10-13 MED ORDER — SODIUM CHLORIDE 0.9 % IV SOLN
20.0000 mg/m2 | Freq: Once | INTRAVENOUS | Status: AC
  Administered 2014-10-13: 33 mg via INTRAVENOUS
  Filled 2014-10-13: qty 33

## 2014-10-13 MED ORDER — PALONOSETRON HCL INJECTION 0.25 MG/5ML
INTRAVENOUS | Status: AC
  Filled 2014-10-13: qty 5

## 2014-10-13 MED ORDER — SODIUM CHLORIDE 0.9 % IV SOLN
Freq: Once | INTRAVENOUS | Status: AC
  Administered 2014-10-13: 08:00:00 via INTRAVENOUS

## 2014-10-13 MED ORDER — SODIUM CHLORIDE 0.9 % IV SOLN
100.0000 mg/m2 | Freq: Once | INTRAVENOUS | Status: AC
  Administered 2014-10-13: 170 mg via INTRAVENOUS
  Filled 2014-10-13: qty 8.5

## 2014-10-13 MED ORDER — SODIUM CHLORIDE 0.9 % IV SOLN
20.0000 mg | Freq: Once | INTRAVENOUS | Status: AC
  Administered 2014-10-13: 20 mg via INTRAVENOUS
  Filled 2014-10-13: qty 2

## 2014-10-13 NOTE — Patient Instructions (Signed)
[place] Discharge Instructions for Patients Receiving Chemotherapy  Today you received the following chemotherapy agents: Cisplatin, Vepesid.  To help prevent nausea and vomiting after your treatment, we encourage you to take your nausea medication as directed.    If you develop nausea and vomiting that is not controlled by your nausea medication, call the clinic.   BELOW ARE SYMPTOMS THAT SHOULD BE REPORTED IMMEDIATELY:  *FEVER GREATER THAN 100.5 F  *CHILLS WITH OR WITHOUT FEVER  NAUSEA AND VOMITING THAT IS NOT CONTROLLED WITH YOUR NAUSEA MEDICATION  *UNUSUAL SHORTNESS OF BREATH  *UNUSUAL BRUISING OR BLEEDING  TENDERNESS IN MOUTH AND THROAT WITH OR WITHOUT PRESENCE OF ULCERS  *URINARY PROBLEMS  *BOWEL PROBLEMS  UNUSUAL RASH Items with * indicate a potential emergency and should be followed up as soon as possible.  Feel free to call the clinic you have any questions or concerns. The clinic phone number is [phone].  Please show the [place] at check-in to the Emergency Department and triage nurse.  [place] Discharge Instructions for Patients Receiving Chemotherapy  Today you received the following chemotherapy agents: Bleocin  To help prevent nausea and vomiting after your treatment, we encourage you to take your nausea medication as prescribed by your physician.   If you develop nausea and vomiting that is not controlled by your nausea medication, call the clinic.   BELOW ARE SYMPTOMS THAT SHOULD BE REPORTED IMMEDIATELY:  *FEVER GREATER THAN 100.5 F  *CHILLS WITH OR WITHOUT FEVER  NAUSEA AND VOMITING THAT IS NOT CONTROLLED WITH YOUR NAUSEA MEDICATION  *UNUSUAL SHORTNESS OF BREATH  *UNUSUAL BRUISING OR BLEEDING  TENDERNESS IN MOUTH AND THROAT WITH OR WITHOUT PRESENCE OF ULCERS  *URINARY PROBLEMS  *BOWEL PROBLEMS  UNUSUAL RASH Items with * indicate a potential emergency and should be followed up as soon as  possible.  Feel free to call the clinic you have any questions or concerns. The clinic phone number is [phone].  Please show the [place] at check-in to the Emergency Department and triage nurse.

## 2014-10-13 NOTE — Progress Notes (Signed)
500cc urine output pre-cisplatin Pt. Has had total output of 1200cc.  He does not want the last approx 60cc of hydration.   Hydration stopped and IV flushed with normal saline

## 2014-10-14 ENCOUNTER — Ambulatory Visit (HOSPITAL_BASED_OUTPATIENT_CLINIC_OR_DEPARTMENT_OTHER): Payer: Self-pay

## 2014-10-14 VITALS — BP 117/72 | HR 60 | Temp 98.0°F | Resp 18

## 2014-10-14 DIAGNOSIS — Z5111 Encounter for antineoplastic chemotherapy: Secondary | ICD-10-CM

## 2014-10-14 DIAGNOSIS — C6290 Malignant neoplasm of unspecified testis, unspecified whether descended or undescended: Secondary | ICD-10-CM

## 2014-10-14 DIAGNOSIS — C6291 Malignant neoplasm of right testis, unspecified whether descended or undescended: Secondary | ICD-10-CM

## 2014-10-14 MED ORDER — SODIUM CHLORIDE 0.9 % IV SOLN
100.0000 mg/m2 | Freq: Once | INTRAVENOUS | Status: AC
  Administered 2014-10-14: 170 mg via INTRAVENOUS
  Filled 2014-10-14: qty 8.5

## 2014-10-14 MED ORDER — DEXTROSE-NACL 5-0.45 % IV SOLN
Freq: Once | INTRAVENOUS | Status: AC
  Administered 2014-10-14: 08:00:00 via INTRAVENOUS
  Filled 2014-10-14: qty 10

## 2014-10-14 MED ORDER — SODIUM CHLORIDE 0.9 % IV SOLN
20.0000 mg | Freq: Once | INTRAVENOUS | Status: AC
  Administered 2014-10-14: 20 mg via INTRAVENOUS
  Filled 2014-10-14: qty 2

## 2014-10-14 MED ORDER — SODIUM CHLORIDE 0.9 % IV SOLN
Freq: Once | INTRAVENOUS | Status: AC
  Administered 2014-10-14: 08:00:00 via INTRAVENOUS

## 2014-10-14 MED ORDER — CISPLATIN CHEMO INJECTION 100MG/100ML
20.0000 mg/m2 | Freq: Once | INTRAVENOUS | Status: AC
  Administered 2014-10-14: 33 mg via INTRAVENOUS
  Filled 2014-10-14: qty 33

## 2014-10-14 NOTE — Progress Notes (Signed)
1376mL urine pre Cisplatin.

## 2014-10-14 NOTE — Patient Instructions (Signed)
[place] Discharge Instructions for Patients Receiving Chemotherapy  Today you received the following chemotherapy agents Etoposide/Cisplatin.  To help prevent nausea and vomiting after your treatment, we encourage you to take your nausea medication as directed.   If you develop nausea and vomiting that is not controlled by your nausea medication, call the clinic.   BELOW ARE SYMPTOMS THAT SHOULD BE REPORTED IMMEDIATELY:  *FEVER GREATER THAN 100.5 F  *CHILLS WITH OR WITHOUT FEVER  NAUSEA AND VOMITING THAT IS NOT CONTROLLED WITH YOUR NAUSEA MEDICATION  *UNUSUAL SHORTNESS OF BREATH  *UNUSUAL BRUISING OR BLEEDING  TENDERNESS IN MOUTH AND THROAT WITH OR WITHOUT PRESENCE OF ULCERS  *URINARY PROBLEMS  *BOWEL PROBLEMS  UNUSUAL RASH Items with * indicate a potential emergency and should be followed up as soon as possible.  Feel free to call the clinic you have any questions or concerns. The clinic phone number is [phone].  Please show the [place] at check-in to the Emergency Department and triage nurse.

## 2014-10-15 ENCOUNTER — Ambulatory Visit (HOSPITAL_BASED_OUTPATIENT_CLINIC_OR_DEPARTMENT_OTHER): Payer: Self-pay

## 2014-10-15 VITALS — BP 127/80 | HR 70 | Temp 97.9°F | Resp 18

## 2014-10-15 DIAGNOSIS — C6291 Malignant neoplasm of right testis, unspecified whether descended or undescended: Secondary | ICD-10-CM

## 2014-10-15 DIAGNOSIS — C6290 Malignant neoplasm of unspecified testis, unspecified whether descended or undescended: Secondary | ICD-10-CM

## 2014-10-15 DIAGNOSIS — Z5111 Encounter for antineoplastic chemotherapy: Secondary | ICD-10-CM

## 2014-10-15 MED ORDER — CISPLATIN CHEMO INJECTION 100MG/100ML
20.0000 mg/m2 | Freq: Once | INTRAVENOUS | Status: AC
  Administered 2014-10-15: 33 mg via INTRAVENOUS
  Filled 2014-10-15: qty 33

## 2014-10-15 MED ORDER — SODIUM CHLORIDE 0.9 % IV SOLN
Freq: Once | INTRAVENOUS | Status: AC
  Administered 2014-10-15: 08:00:00 via INTRAVENOUS

## 2014-10-15 MED ORDER — PALONOSETRON HCL INJECTION 0.25 MG/5ML
INTRAVENOUS | Status: AC
  Filled 2014-10-15: qty 5

## 2014-10-15 MED ORDER — ETOPOSIDE CHEMO INJECTION 1 GM/50ML
100.0000 mg/m2 | Freq: Once | INTRAVENOUS | Status: AC
  Administered 2014-10-15: 170 mg via INTRAVENOUS
  Filled 2014-10-15: qty 8.5

## 2014-10-15 MED ORDER — PALONOSETRON HCL INJECTION 0.25 MG/5ML
0.2500 mg | Freq: Once | INTRAVENOUS | Status: AC
  Administered 2014-10-15: 0.25 mg via INTRAVENOUS

## 2014-10-15 MED ORDER — SODIUM CHLORIDE 0.9 % IV SOLN
20.0000 mg | Freq: Once | INTRAVENOUS | Status: AC
  Administered 2014-10-15: 20 mg via INTRAVENOUS
  Filled 2014-10-15: qty 2

## 2014-10-15 MED ORDER — POTASSIUM CHLORIDE 2 MEQ/ML IV SOLN
Freq: Once | INTRAVENOUS | Status: AC
  Administered 2014-10-15: 08:00:00 via INTRAVENOUS
  Filled 2014-10-15: qty 10

## 2014-10-15 NOTE — Patient Instructions (Signed)
[facility] Discharge Instructions for Patients Receiving Chemotherapy  Today you received the following chemotherapy agents: Etoposide and Cisplatin.  To help prevent nausea and vomiting after your treatment, we encourage you to take your nausea medication as directed.   If you develop nausea and vomiting that is not controlled by your nausea medication, call the clinic.   BELOW ARE SYMPTOMS THAT SHOULD BE REPORTED IMMEDIATELY:  *FEVER GREATER THAN 100.5 F  *CHILLS WITH OR WITHOUT FEVER  NAUSEA AND VOMITING THAT IS NOT CONTROLLED WITH YOUR NAUSEA MEDICATION  *UNUSUAL SHORTNESS OF BREATH  *UNUSUAL BRUISING OR BLEEDING  TENDERNESS IN MOUTH AND THROAT WITH OR WITHOUT PRESENCE OF ULCERS  *URINARY PROBLEMS  *BOWEL PROBLEMS  UNUSUAL RASH Items with * indicate a potential emergency and should be followed up as soon as possible.  Feel free to call the clinic you have any questions or concerns. The clinic phone number is [phone].  Please show the CHEMO ALERT CARD at check-in to the Emergency Department and triage nurse.   

## 2014-10-15 NOTE — Progress Notes (Signed)
Cisplatin hydration stopped at 1313 with approx 200cc left per pt request. Pt aware of importance of receiving entire amount of fluids. Pt verbalizes understanding but states he has to go in order to pick up his step-kids. Pt educated to be sure to drink plenty of fluids. Pt verbalizes understanding. Pt stable at time of discharge.

## 2014-10-16 ENCOUNTER — Ambulatory Visit (HOSPITAL_BASED_OUTPATIENT_CLINIC_OR_DEPARTMENT_OTHER): Payer: Self-pay

## 2014-10-16 VITALS — BP 132/57 | HR 81 | Temp 97.7°F | Resp 18

## 2014-10-16 DIAGNOSIS — Z5189 Encounter for other specified aftercare: Secondary | ICD-10-CM

## 2014-10-16 DIAGNOSIS — C6291 Malignant neoplasm of right testis, unspecified whether descended or undescended: Secondary | ICD-10-CM

## 2014-10-16 MED ORDER — PEGFILGRASTIM INJECTION 6 MG/0.6ML ~~LOC~~
6.0000 mg | PREFILLED_SYRINGE | Freq: Once | SUBCUTANEOUS | Status: AC
  Administered 2014-10-16: 6 mg via SUBCUTANEOUS

## 2014-10-19 ENCOUNTER — Ambulatory Visit: Payer: Self-pay

## 2014-10-19 ENCOUNTER — Other Ambulatory Visit (HOSPITAL_BASED_OUTPATIENT_CLINIC_OR_DEPARTMENT_OTHER): Payer: Self-pay

## 2014-10-19 ENCOUNTER — Ambulatory Visit (HOSPITAL_BASED_OUTPATIENT_CLINIC_OR_DEPARTMENT_OTHER): Payer: Self-pay

## 2014-10-19 ENCOUNTER — Other Ambulatory Visit: Payer: Self-pay | Admitting: Oncology

## 2014-10-19 VITALS — BP 123/70 | HR 76 | Temp 98.3°F | Resp 18

## 2014-10-19 DIAGNOSIS — C6290 Malignant neoplasm of unspecified testis, unspecified whether descended or undescended: Secondary | ICD-10-CM

## 2014-10-19 DIAGNOSIS — Z5111 Encounter for antineoplastic chemotherapy: Secondary | ICD-10-CM

## 2014-10-19 DIAGNOSIS — C6291 Malignant neoplasm of right testis, unspecified whether descended or undescended: Secondary | ICD-10-CM

## 2014-10-19 LAB — COMPREHENSIVE METABOLIC PANEL (CC13)
ALT: 15 U/L (ref 0–55)
ANION GAP: 8 meq/L (ref 3–11)
AST: 12 U/L (ref 5–34)
Albumin: 4.1 g/dL (ref 3.5–5.0)
Alkaline Phosphatase: 94 U/L (ref 40–150)
BILIRUBIN TOTAL: 0.81 mg/dL (ref 0.20–1.20)
BUN: 16 mg/dL (ref 7.0–26.0)
CHLORIDE: 106 meq/L (ref 98–109)
CO2: 23 meq/L (ref 22–29)
CREATININE: 0.8 mg/dL (ref 0.7–1.3)
Calcium: 9.7 mg/dL (ref 8.4–10.4)
EGFR: >90 mL/min/{1.73_m2} (ref >90)
GLUCOSE: 93 mg/dL (ref 70–140)
Potassium: 4.7 mEq/L (ref 3.5–5.1)
SODIUM: 137 meq/L (ref 136–145)
TOTAL PROTEIN: 6.9 g/dL (ref 6.4–8.3)

## 2014-10-19 LAB — CBC WITH DIFFERENTIAL/PLATELET
BASO%: 0.2 % (ref 0.0–2.0)
Basophils Absolute: 0 10*3/uL (ref 0.0–0.1)
EOS%: 0.2 % (ref 0.0–7.0)
Eosinophils Absolute: 0 10*3/uL (ref 0.0–0.5)
HCT: 38.4 % (ref 38.4–49.9)
HGB: 13 g/dL (ref 13.0–17.1)
LYMPH%: 3.4 % — AB (ref 14.0–49.0)
MCH: 31 pg (ref 27.2–33.4)
MCHC: 33.8 g/dL (ref 32.0–36.0)
MCV: 91.7 fL (ref 79.3–98.0)
MONO#: 0.4 10*3/uL (ref 0.1–0.9)
MONO%: 1.8 % (ref 0.0–14.0)
NEUT%: 94.4 % — AB (ref 39.0–75.0)
NEUTROS ABS: 18.5 10*3/uL — AB (ref 1.5–6.5)
Platelets: 274 10*3/uL (ref 140–400)
RBC: 4.19 10*6/uL — AB (ref 4.20–5.82)
RDW: 13.8 % (ref 11.0–14.6)
WBC: 19.6 10*3/uL — AB (ref 4.0–10.3)
lymph#: 0.7 10*3/uL — ABNORMAL LOW (ref 0.9–3.3)

## 2014-10-19 MED ORDER — SODIUM CHLORIDE 0.9 % IV SOLN
30.0000 [IU] | Freq: Once | INTRAVENOUS | Status: AC
  Administered 2014-10-19: 30 [IU] via INTRAVENOUS
  Filled 2014-10-19: qty 10

## 2014-10-19 MED ORDER — PROCHLORPERAZINE MALEATE 10 MG PO TABS
ORAL_TABLET | ORAL | Status: AC
  Filled 2014-10-19: qty 1

## 2014-10-19 MED ORDER — PROCHLORPERAZINE MALEATE 10 MG PO TABS
10.0000 mg | ORAL_TABLET | Freq: Once | ORAL | Status: AC
  Administered 2014-10-19: 10 mg via ORAL

## 2014-10-19 MED ORDER — SODIUM CHLORIDE 0.9 % IJ SOLN
10.0000 mL | INTRAMUSCULAR | Status: DC | PRN
  Filled 2014-10-19: qty 10

## 2014-10-19 MED ORDER — PROCHLORPERAZINE MALEATE 10 MG PO TABS: 10.0000 mg | ORAL_TABLET | Freq: Four times a day (QID) | ORAL | Status: DC | PRN

## 2014-10-19 MED ORDER — ONDANSETRON HCL 8 MG PO TABS: 8.0000 mg | ORAL_TABLET | Freq: Three times a day (TID) | ORAL | Status: DC | PRN

## 2014-10-19 MED ORDER — HEPARIN SOD (PORK) LOCK FLUSH 100 UNIT/ML IV SOLN
500.0000 [IU] | Freq: Once | INTRAVENOUS | Status: DC | PRN
  Filled 2014-10-19: qty 5

## 2014-10-19 MED ORDER — SODIUM CHLORIDE 0.9 % IV SOLN
Freq: Once | INTRAVENOUS | Status: AC
  Administered 2014-10-19: 10:00:00 via INTRAVENOUS

## 2014-10-19 MED ORDER — HYDROCODONE-ACETAMINOPHEN 7.5-325 MG PO TABS: 1.0000 | ORAL_TABLET | Freq: Four times a day (QID) | ORAL | Status: DC | PRN

## 2014-10-19 NOTE — Patient Instructions (Signed)
[facility] for [facility] Receiving Chemotherapy  Today you received the following chemotherapy agents: Bleomycin  To help prevent nausea and vomiting after your treatment, we encourage you to take your nausea medication as directed. If you develop nausea and vomiting that is not controlled by your nausea medication, call the clinic.   BELOW ARE SYMPTOMS THAT SHOULD BE REPORTED IMMEDIATELY:  *FEVER GREATER THAN 100.5 F  *CHILLS WITH OR WITHOUT FEVER  NAUSEA AND VOMITING THAT IS NOT CONTROLLED WITH YOUR NAUSEA MEDICATION  *UNUSUAL SHORTNESS OF BREATH  *UNUSUAL BRUISING OR BLEEDING  TENDERNESS IN MOUTH AND THROAT WITH OR WITHOUT PRESENCE OF ULCERS  *URINARY PROBLEMS  *BOWEL PROBLEMS  UNUSUAL RASH Items with * indicate a potential emergency and should be followed up as soon as possible.  Feel free to call the clinic you have any questions or concerns. The clinic phone number is [phone].  Please show the CHEMO ALERT CARD at check-in to the Emergency Department and triage nurse.   

## 2014-10-19 NOTE — Progress Notes (Signed)
Per dr Alen Blew, Hand delivered script for hydrocodone to patient in treatment room section D

## 2014-10-19 NOTE — Progress Notes (Signed)
5110: Pt states that He has been Nauseous and vomiting 4-5 times a day and having diarrhea 4-5 times a day since Sunday 10/17/14. Pt states that his home medications has not been working and that he is out of his nausea and pain medications. Dr. Alen Blew aware and states he will call in the medications to pt pharmacy. Pt aware and verbalizes understanding.

## 2014-10-19 NOTE — Addendum Note (Signed)
Addended by: Wyatt Portela on: 10/19/2014 10:10 AM   Modules accepted: Orders

## 2014-10-20 ENCOUNTER — Ambulatory Visit: Payer: Self-pay

## 2014-10-21 ENCOUNTER — Ambulatory Visit: Payer: Self-pay

## 2014-10-21 ENCOUNTER — Encounter: Payer: Self-pay | Admitting: Oncology

## 2014-10-21 NOTE — Progress Notes (Signed)
I called and spoke with Merrisa at [facility]--  [phone] and she gave me fax [phone] to send proof of Aloxi treatment/amts. The patient is approved for asst with [place]  Patient id# [id number]

## 2014-10-21 NOTE — Progress Notes (Signed)
Per Vaughan Basta at [employer] I need to resend form using R11.2 diag code--not cancer code. [phone]. They need for asst with [facility]. I faxed

## 2014-10-22 ENCOUNTER — Ambulatory Visit: Payer: Self-pay

## 2014-10-26 ENCOUNTER — Other Ambulatory Visit (HOSPITAL_BASED_OUTPATIENT_CLINIC_OR_DEPARTMENT_OTHER): Payer: Self-pay

## 2014-10-26 ENCOUNTER — Ambulatory Visit (HOSPITAL_BASED_OUTPATIENT_CLINIC_OR_DEPARTMENT_OTHER): Payer: Self-pay

## 2014-10-26 ENCOUNTER — Ambulatory Visit (HOSPITAL_BASED_OUTPATIENT_CLINIC_OR_DEPARTMENT_OTHER): Payer: Self-pay | Admitting: Oncology

## 2014-10-26 ENCOUNTER — Telehealth: Payer: Self-pay | Admitting: Oncology

## 2014-10-26 VITALS — BP 122/63 | HR 80 | Temp 97.4°F | Resp 16 | Ht 68.0 in | Wt 135.4 lb

## 2014-10-26 DIAGNOSIS — C6291 Malignant neoplasm of right testis, unspecified whether descended or undescended: Secondary | ICD-10-CM

## 2014-10-26 DIAGNOSIS — C6290 Malignant neoplasm of unspecified testis, unspecified whether descended or undescended: Secondary | ICD-10-CM

## 2014-10-26 DIAGNOSIS — D709 Neutropenia, unspecified: Secondary | ICD-10-CM

## 2014-10-26 DIAGNOSIS — Z5111 Encounter for antineoplastic chemotherapy: Secondary | ICD-10-CM

## 2014-10-26 LAB — COMPREHENSIVE METABOLIC PANEL (CC13)
ALBUMIN: 3.7 g/dL (ref 3.5–5.0)
ALK PHOS: 84 U/L (ref 40–150)
ALT: 16 U/L (ref 0–55)
ANION GAP: 8 meq/L (ref 3–11)
AST: 15 U/L (ref 5–34)
BUN: 10.9 mg/dL (ref 7.0–26.0)
CO2: 24 mEq/L (ref 22–29)
Calcium: 9.3 mg/dL (ref 8.4–10.4)
Chloride: 108 mEq/L (ref 98–109)
Creatinine: 0.9 mg/dL (ref 0.7–1.3)
Glucose: 115 mg/dl (ref 70–140)
POTASSIUM: 4.1 meq/L (ref 3.5–5.1)
Sodium: 140 mEq/L (ref 136–145)
TOTAL PROTEIN: 6.7 g/dL (ref 6.4–8.3)

## 2014-10-26 LAB — CBC WITH DIFFERENTIAL/PLATELET
BASO%: 1.6 % (ref 0.0–2.0)
BASOS ABS: 0.2 10*3/uL — AB (ref 0.0–0.1)
EOS ABS: 0.1 10*3/uL (ref 0.0–0.5)
EOS%: 0.6 % (ref 0.0–7.0)
HCT: 36.5 % — ABNORMAL LOW (ref 38.4–49.9)
HGB: 12.5 g/dL — ABNORMAL LOW (ref 13.0–17.1)
LYMPH%: 12.6 % — AB (ref 14.0–49.0)
MCH: 31.4 pg (ref 27.2–33.4)
MCHC: 34.2 g/dL (ref 32.0–36.0)
MCV: 91.7 fL (ref 79.3–98.0)
MONO#: 1.2 10*3/uL — AB (ref 0.1–0.9)
MONO%: 11.1 % (ref 0.0–14.0)
NEUT%: 74.1 % (ref 39.0–75.0)
NEUTROS ABS: 8.2 10*3/uL — AB (ref 1.5–6.5)
PLATELETS: 137 10*3/uL — AB (ref 140–400)
RBC: 3.98 10*6/uL — AB (ref 4.20–5.82)
RDW: 14.5 % (ref 11.0–14.6)
WBC: 11.1 10*3/uL — AB (ref 4.0–10.3)
lymph#: 1.4 10*3/uL (ref 0.9–3.3)

## 2014-10-26 MED ORDER — SODIUM CHLORIDE 0.9 % IV SOLN
30.0000 [IU] | Freq: Once | INTRAVENOUS | Status: AC
  Administered 2014-10-26: 30 [IU] via INTRAVENOUS
  Filled 2014-10-26: qty 10

## 2014-10-26 MED ORDER — PROCHLORPERAZINE MALEATE 10 MG PO TABS
10.0000 mg | ORAL_TABLET | Freq: Once | ORAL | Status: AC
  Administered 2014-10-26: 10 mg via ORAL

## 2014-10-26 MED ORDER — HYDROCODONE-ACETAMINOPHEN 7.5-325 MG PO TABS: 1.0000 | ORAL_TABLET | Freq: Four times a day (QID) | ORAL | Status: DC | PRN

## 2014-10-26 MED ORDER — PROCHLORPERAZINE MALEATE 10 MG PO TABS
ORAL_TABLET | ORAL | Status: AC
  Filled 2014-10-26: qty 1

## 2014-10-26 MED ORDER — LORAZEPAM 1 MG PO TABS: 1.0000 mg | ORAL_TABLET | Freq: Every evening | ORAL | Status: DC | PRN

## 2014-10-26 MED ORDER — SODIUM CHLORIDE 0.9 % IV SOLN
Freq: Once | INTRAVENOUS | Status: AC
  Administered 2014-10-26: 10:00:00 via INTRAVENOUS

## 2014-10-26 MED ORDER — ONDANSETRON HCL 8 MG PO TABS: 8.0000 mg | ORAL_TABLET | Freq: Three times a day (TID) | ORAL | Status: DC | PRN

## 2014-10-26 NOTE — Telephone Encounter (Signed)
gae adn printed appt sched and avs for pt fo rSept adn OCT....gv barium

## 2014-10-26 NOTE — Progress Notes (Signed)
Hematology and Oncology Follow Up Visit  Kelly Garza [id number] 1990/04/04 24 y.o. 10/26/2014 9:53 AM No PCP Per PatientNo ref. 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 He had LV I an elevated tumor marker of alpha-fetoprotein is 2038 and beta-hCG of 942. Imaging studies which showed pulmonary nodules suspicious for stage IIIa disease.   Prior Therapy: He is status post orchiectomy done on 04/19/2014.  Current therapy: BEP systemic chemotherapy started on 08/30/2014. He is here for day 16 of cycle 3.  Interim History: Kelly Garza today for a follow-up visit accompanied by his family. Since the last visit, he continues to tolerate systemic chemotherapy with few complications noted. He does report grade 1 nausea that is controlled by antiemetics. He does report arthralgias and myalgias and have required hydrocodone to control his pain. He did not have any complications related to bleomycin. He did not report any fevers or infusion related complications.   He does report occasional cough and congestion and occasional wheezing that is consistent with his asthma. No fever or dyspnea on exertion.   He does not report any headaches, blurry vision, syncope or seizures. He does not report any fevers, chills, sweats or weight loss. He does not report any chest pain, palpitation, orthopnea or PND. He does not report any cough, wheezing, hemoptysis, shortness of breath, dyspnea on exertion or difficulty breathing. He does not report any nausea, vomiting, abdominal pain, constipation, diarrhea, hematochezia or change in his bowel habits. He does not report any frequency, urgency, hesitancy or hematuria. He does not report any skeletal complaints including arthralgias myalgias. He does not report any total cold intolerance.  Does not report any mood disorder clearing anxiety or depression. Remaining review of systems unremarkable  Medications: I have reviewed the patient's current medications.  Current Outpatient Prescriptions  Medication Sig Dispense Refill  . albuterol (PROVENTIL HFA;VENTOLIN HFA) 108 (90 BASE) MCG/ACT inhaler Inhale into the lungs every 6 (six) hours as needed for wheezing or shortness of breath.    [facility] HYDROcodone-acetaminophen (NORCO) 7.5-325 MG per tablet Take 1-2 tablets by mouth every 6 (six) hours as needed for moderate pain. 30 tablet 0  . HYDROcodone-acetaminophen (NORCO) 7.5-325 MG per tablet Take 1 tablet by mouth every 6 (six) hours as needed for moderate pain. 30 tablet 0  . ibuprofen (ADVIL,MOTRIN) 200 MG tablet Take 200 mg by mouth every 6 (six) hours as needed.    [facility] Kitchen LORazepam (ATIVAN) 1 MG tablet Take 1 tablet (1 mg total) by mouth at bedtime as needed for anxiety or sleep. 30 tablet 0  . nicotine (NICODERM CQ - DOSED IN MG/24 HR) 7 mg/24hr patch Place 7 mg onto the skin daily.    Mar[place] Kitchen omeprazole (PRILOSEC) 20 MG capsule Take 1 capsule (20 mg total) by mouth daily. 30 capsule 1  . ondansetron (ZOFRAN) 8 MG tablet Take 1 tablet (8 mg total) by mouth every 8 (eight) hours as needed for nausea or vomiting. 20 tablet 0  . prochlorperazine (COMPAZINE) 10 MG tablet Take 1 tablet (10 mg total) by mouth every 6 (six) hours as needed for nausea or vomiting. 30 tablet 2  . sulfamethoxazole-trimethoprim (BACTRIM DS,SEPTRA DS) 800-160 MG per tablet Take 1 tablet by mouth 2 (two) times daily. X 14 days 28 tablet 0  . testosterone enanthate (DELATESTRYL) 200 MG/ML injection Inject 200  mg into the muscle every 14 (fourteen) days. For IM use only     No current facility-administered medications for this visit.     Allergies: No Known Allergies  Past Medical History, Surgical history, Social history, and Family History were reviewed and updated.   Physical Exam: Blood pressure 122/63, pulse 80,  temperature 97.4 F (36.3 C), temperature source Oral, resp. rate 16, height 5\' 8"  (1.727 m), weight 135 lb 6.4 oz (61.417 kg), SpO2 98 %. ECOG: 0 General appearance: alert and cooperative appeared in no active distress. Head: Normocephalic, without obvious abnormality. No erythema or induration noted at the angle of the jaw. Neck: no adenopathy, no carotid bruit, no JVD and supple, symmetrical, trachea midline Lymph nodes: Cervical, supraclavicular, and axillary nodes normal. Heart:regular rate and rhythm, S1, S2 normal, no murmur, click, rub or gallop Lung:chest clear, no wheezing, rales, normal symmetric air entry. No dullness to percussion. Abdomin: soft, non-tender, without masses or organomegaly EXT:no erythema, induration, or nodules Skin: Clear without any rashes or lesions.  Lab Results: Lab Results  Component Value Date   WBC 11.1* 10/26/2014   HGB 12.5* 10/26/2014   HCT 36.5* 10/26/2014   MCV 91.7 10/26/2014   PLT 137* 10/26/2014     Chemistry      Component Value Date/Time   NA 140 10/26/2014 0914   K 4.1 10/26/2014 0914   CO2 24 10/26/2014 0914   BUN 10.9 10/26/2014 0914   CREATININE 0.9 10/26/2014 0914      Component Value Date/Time   CALCIUM 9.3 10/26/2014 0914   ALKPHOS 84 10/26/2014 0914   AST 15 10/26/2014 0914   ALT 16 10/26/2014 0914   BILITOT <0.20 10/26/2014 0914        Results for Kelly Garza, Kelly Garza (MRN [id number]) as of 10/26/2014 09:13  Ref. Range 10/11/2014 08:57  AFP Tumor Marker Latest Ref Range: <6.1 ng/mL 1.8  Beta hCG, Tumor Marker Latest Ref Range: <5.0 mIU/mL 2.8       Impression and Plan:   24 year old gentleman with the following issues:  1. 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 The pathology showed a focal LVI . His tumor markers were elevated with  alpha-fetoprotein of 2038 and a beta-hCG of 942. Alpha-fetoprotein normalized postoperatively and his beta-hCG after a nadir of 18 was climbing up to 412.  Imaging studies including a CT scan on 08/25/2014 indicated  pulmonary nodules that could be suspicious for malignancy. His staging including stage IS versus stage IIIa.  He is currently receiving BEP systemic chemotherapy without any major complications. He is ready to proceed with day 16 of cycle 3. No major complications related to systemic chemotherapy noted. His tumor markers after 2 cycle of therapy have normalized with a beta-hCG of 2.8.  The plan is to complete 3 cycles of chemotherapy which we concluded today and repeat imaging studies for staging purposes. If he is in complete response at that time, no further therapy will be needed and he will be on observation surveillance.  The goal of systemic chemotherapy is curative at this time.  2. Pulmonary toxicity prophylaxis: His pulmonary function tests showed a normal DLCO. I continue to question him about respiratory symptoms associated with the bleomycin. I also educated him about smoking cessation. He reports that he does not smoked since the start of chemotherapy.  He has no respiratory symptoms or any suggestions of pulmonary toxicity. He does report some occasional cough and congestion that is likely related to asthma or allergy. His pulmonary examination is unremarkable. His oxygen saturation is 98% today.  3. Neutropenia prophylaxis:Neulasta will be added to his chemotherapy regimen if his neutropenia persists at the beginning of cycle 2. His white cell count is normal.  4. IV access: He declined a Port-A-Cath and currently using the peripheral veins.  5. Antiemetics: Prescription for Zofran and Compazine available to the patient and seems to be working at this time. I refilled his Zofran today.  6. Fertility issues: He has underwent sperm banking prior to his orchiectomy in March  2016.  7. Neck abscess: resolved. No signs of recurrence at this time.  8. Pain: Predominantly arthralgia and myalgia and he is hydrocodone with excellent response. I have refilled his medication today.  9. Insomnia: Uses Ativan to help with nausea and insomnia which have helped symptoms. I have refilled his Ativan today.  10. Follow-up: Will be on 11/16/2014 after repeat imaging studies.     [facility], MD 9/13/20169:53 AM

## 2014-10-26 NOTE — Patient Instructions (Signed)
[place] Discharge Instructions for Patients Receiving Chemotherapy  Today you received the following chemotherapy agents: Bleomycin.  To help prevent nausea and vomiting after your treatment, we encourage you to take your nausea medication: Compazine. Take one every 6 hours as needed.   If you develop nausea and vomiting that is not controlled by your nausea medication, call the clinic.   BELOW ARE SYMPTOMS THAT SHOULD BE REPORTED IMMEDIATELY:  *FEVER GREATER THAN 100.5 F  *CHILLS WITH OR WITHOUT FEVER  NAUSEA AND VOMITING THAT IS NOT CONTROLLED WITH YOUR NAUSEA MEDICATION  *UNUSUAL SHORTNESS OF BREATH  *UNUSUAL BRUISING OR BLEEDING  TENDERNESS IN MOUTH AND THROAT WITH OR WITHOUT PRESENCE OF ULCERS  *URINARY PROBLEMS  *BOWEL PROBLEMS  UNUSUAL RASH Items with * indicate a potential emergency and should be followed up as soon as possible.  Feel free to call the clinic should you have any questions or concerns. The clinic phone number is [phone].  Please show the [place] at check-in to the Emergency Department and triage nurse.  CONGRATULATIONS ON COMPLETING YOUR TREATMENT!

## 2014-11-02 ENCOUNTER — Encounter: Payer: Self-pay | Admitting: *Deleted

## 2014-11-02 ENCOUNTER — Telehealth: Payer: Self-pay | Admitting: *Deleted

## 2014-11-02 ENCOUNTER — Other Ambulatory Visit: Payer: Self-pay | Admitting: *Deleted

## 2014-11-02 DIAGNOSIS — C6290 Malignant neoplasm of unspecified testis, unspecified whether descended or undescended: Secondary | ICD-10-CM

## 2014-11-02 MED ORDER — HYDROCODONE-ACETAMINOPHEN 7.5-325 MG PO TABS: 1.0000 | ORAL_TABLET | Freq: Four times a day (QID) | ORAL | Status: DC | PRN

## 2014-11-02 NOTE — Telephone Encounter (Signed)
Printed 11-02-2014.

## 2014-11-02 NOTE — Telephone Encounter (Signed)
Returned a call to candy, patient's mom. Left megssage on voice mail to call me. [phone]

## 2014-11-02 NOTE — Telephone Encounter (Signed)
Voicemail from patient's mom, requesting refill on pain medication.

## 2014-11-02 NOTE — Telephone Encounter (Signed)
Spoke with candy, patient's mother. Refill script for norco ready for p/u before 4:00 pm.

## 2014-11-09 ENCOUNTER — Other Ambulatory Visit (HOSPITAL_BASED_OUTPATIENT_CLINIC_OR_DEPARTMENT_OTHER): Payer: Self-pay

## 2014-11-09 ENCOUNTER — Encounter (HOSPITAL_COMMUNITY): Payer: Self-pay

## 2014-11-09 ENCOUNTER — Ambulatory Visit (HOSPITAL_COMMUNITY)
Admission: RE | Admit: 2014-11-09 | Discharge: 2014-11-09 | Disposition: A | Discharge status: Home / Self Care | Payer: Self-pay | Source: Ambulatory Visit | Attending: Oncology | Admitting: Oncology

## 2014-11-09 ENCOUNTER — Other Ambulatory Visit: Payer: Self-pay | Admitting: *Deleted

## 2014-11-09 DIAGNOSIS — J439 Emphysema, unspecified: Secondary | ICD-10-CM | POA: Insufficient documentation

## 2014-11-09 DIAGNOSIS — C6290 Malignant neoplasm of unspecified testis, unspecified whether descended or undescended: Secondary | ICD-10-CM

## 2014-11-09 LAB — CBC WITH DIFFERENTIAL/PLATELET
BASO%: 1.3 % (ref 0.0–2.0)
Basophils Absolute: 0.1 10*3/uL (ref 0.0–0.1)
EOS ABS: 0.1 10*3/uL (ref 0.0–0.5)
EOS%: 1.6 % (ref 0.0–7.0)
HCT: 37.5 % — ABNORMAL LOW (ref 38.4–49.9)
HEMOGLOBIN: 12.7 g/dL — AB (ref 13.0–17.1)
LYMPH#: 0.8 10*3/uL — AB (ref 0.9–3.3)
LYMPH%: 9.1 % — ABNORMAL LOW (ref 14.0–49.0)
MCH: 31.4 pg (ref 27.2–33.4)
MCHC: 33.9 g/dL (ref 32.0–36.0)
MCV: 92.9 fL (ref 79.3–98.0)
MONO#: 1 10*3/uL — AB (ref 0.1–0.9)
MONO%: 11.3 % (ref 0.0–14.0)
NEUT%: 76.7 % — ABNORMAL HIGH (ref 39.0–75.0)
NEUTROS ABS: 6.9 10*3/uL — AB (ref 1.5–6.5)
Platelets: 336 10*3/uL (ref 140–400)
RBC: 4.04 10*6/uL — ABNORMAL LOW (ref 4.20–5.82)
RDW: 16 % — AB (ref 11.0–14.6)
WBC: 9 10*3/uL (ref 4.0–10.3)

## 2014-11-09 LAB — COMPREHENSIVE METABOLIC PANEL (CC13)
ALBUMIN: 3.9 g/dL (ref 3.5–5.0)
ALT: 20 U/L (ref 0–55)
AST: 21 U/L (ref 5–34)
Alkaline Phosphatase: 68 U/L (ref 40–150)
Anion Gap: 10 mEq/L (ref 3–11)
BUN: 9.5 mg/dL (ref 7.0–26.0)
CO2: 23 mEq/L (ref 22–29)
CREATININE: 0.8 mg/dL (ref 0.7–1.3)
Calcium: 9.4 mg/dL (ref 8.4–10.4)
Chloride: 107 mEq/L (ref 98–109)
GLUCOSE: 105 mg/dL (ref 70–140)
Potassium: 4.2 mEq/L (ref 3.5–5.1)
SODIUM: 140 meq/L (ref 136–145)
TOTAL PROTEIN: 6.7 g/dL (ref 6.4–8.3)
Total Bilirubin: 0.3 mg/dL (ref 0.20–1.20)

## 2014-11-09 LAB — LACTATE DEHYDROGENASE (CC13): LDH: 192 U/L (ref 125–245)

## 2014-11-09 MED ORDER — HYDROCODONE-ACETAMINOPHEN 7.5-325 MG PO TABS: 1.0000 | ORAL_TABLET | Freq: Four times a day (QID) | ORAL | Status: DC | PRN

## 2014-11-09 MED ORDER — IOHEXOL 300 MG/ML  SOLN
100.0000 mL | Freq: Once | INTRAMUSCULAR | Status: AC | PRN
  Administered 2014-11-09: 100 mL via INTRAVENOUS

## 2014-11-12 LAB — BETA HCG QUANT (REF LAB)

## 2014-11-12 LAB — AFP TUMOR MARKER: AFP TUMOR MARKER: 2 ng/mL (ref <6.1)

## 2014-11-16 ENCOUNTER — Telehealth: Payer: Self-pay | Admitting: Oncology

## 2014-11-16 ENCOUNTER — Ambulatory Visit (HOSPITAL_BASED_OUTPATIENT_CLINIC_OR_DEPARTMENT_OTHER): Payer: Self-pay | Admitting: Oncology

## 2014-11-16 VITALS — BP 120/59 | HR 74 | Temp 98.1°F | Resp 19 | Ht 68.0 in | Wt 140.1 lb

## 2014-11-16 DIAGNOSIS — M2550 Pain in unspecified joint: Secondary | ICD-10-CM

## 2014-11-16 DIAGNOSIS — G4700 Insomnia, unspecified: Secondary | ICD-10-CM

## 2014-11-16 DIAGNOSIS — C6290 Malignant neoplasm of unspecified testis, unspecified whether descended or undescended: Secondary | ICD-10-CM

## 2014-11-16 DIAGNOSIS — M791 Myalgia: Secondary | ICD-10-CM

## 2014-11-16 DIAGNOSIS — C6291 Malignant neoplasm of right testis, unspecified whether descended or undescended: Secondary | ICD-10-CM

## 2014-11-16 DIAGNOSIS — C6292 Malignant neoplasm of left testis, unspecified whether descended or undescended: Secondary | ICD-10-CM

## 2014-11-16 MED ORDER — LORAZEPAM 1 MG PO TABS: 1.0000 mg | ORAL_TABLET | Freq: Every evening | ORAL | Status: DC | PRN

## 2014-11-16 MED ORDER — HYDROCODONE-ACETAMINOPHEN 7.5-325 MG PO TABS: ORAL_TABLET | ORAL | Status: DC

## 2014-11-16 NOTE — Progress Notes (Signed)
Hematology and Oncology Follow Up Visit  Kelly Garza [id number] Nov 28, 1990 24 y.o. 11/16/2014 2:25 PM No PCP Per PatientNo ref. 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 He had LV I an elevated tumor marker of alpha-fetoprotein is 2038 and beta-hCG of 942. Imaging studies which showed pulmonary nodules suspicious for stage IIIa disease.   Prior Therapy:  He is status post orchiectomy done on 04/19/2014.  BEP systemic chemotherapy started on 08/30/2014. He is S/P 3 cycles of BEP completed on 10/26/2014 with complete normalization of his tumor markers.  Current therapy: Observation and surveillance.  Interim History: Kelly Garza presents today for a follow-up visit accompanied by his family. Since the last visit, he completyed systemic chemotherapy with few complications noted.  He does report arthralgias and myalgias and have required hydrocodone to control his pain. His pain is predominantly in the right hip as well as in his hands. His pain is improving per his report. He is not reporting any respiratory symptoms at this time. Has not reported any cough or hemoptysis. His performance status is improving at this time.   He does not report any headaches, blurry vision, syncope or seizures. He does not report any fevers, chills, sweats or weight loss. He does not report any chest pain, palpitation, orthopnea or PND. He does not report any cough, wheezing, hemoptysis, shortness of breath, dyspnea on exertion or difficulty breathing. He does not report any nausea, vomiting, abdominal pain, constipation, diarrhea, hematochezia or change in his bowel habits. He does not report any frequency, urgency, hesitancy or hematuria. He does not report any skeletal complaints including arthralgias myalgias. He does not report any total  cold intolerance. Does not report any mood disorder clearing anxiety or depression. Remaining review of systems unremarkable  Medications: I have reviewed the patient's current medications.  Current Outpatient Prescriptions  Medication Sig Dispense Refill  . albuterol (PROVENTIL HFA;VENTOLIN HFA) 108 (90 BASE) MCG/ACT inhaler Inhale into the lungs every 6 (six) hours as needed for wheezing or shortness of breath.    Marland Kitchen HYDROcodone-acetaminophen (NORCO) 7.5-325 MG tablet Take one tablet three times a day as needed for 2 weeks. Then twice a day as needed for two weeks. Take one tablet once a day as needed for two weeks. Then stop. 30 tablet 0  . ibuprofen (ADVIL,MOTRIN) 200 MG tablet Take 200 mg by mouth every 6 (six) hours as needed.    Marland Kitchen LORazepam (ATIVAN) 1 MG tablet Take 1 tablet (1 mg total) by mouth at bedtime as needed for anxiety or sleep. 30 tablet 0  . nicotine (NICODERM CQ - DOSED IN MG/24 HR) 7 mg/24hr patch Place 7 mg onto the skin daily.    Marland Kitchen omeprazole (PRILOSEC) 20 MG capsule Take 1 capsule (20 mg total) by mouth daily. 30 capsule 1  . sulfamethoxazole-trimethoprim (BACTRIM DS,SEPTRA DS) 800-160 MG per tablet Take 1 tablet by mouth 2 (two) times daily. X 14 days 28 tablet 0  . testosterone enanthate (DELATESTRYL) 200 MG/ML injection Inject 200 mg into the muscle every 14 (fourteen) days. For IM use only     No current facility-administered medications for this visit.     Allergies: No Known Allergies  Past Medical History, Surgical history, Social history, and Family History were reviewed and updated.   Physical Exam: Blood pressure 120/59, pulse 74, temperature 98.1 F (  36.7 C), temperature source Oral, resp. rate 19, height 5\' 8"  (1.727 m), weight 140 lb 1.6 oz (63.549 kg), SpO2 98 %. ECOG: 0 General appearance: alert and cooperative without distress. Head: Normocephalic, without obvious abnormality. No abscesses or lesions. Neck: no adenopathy, no carotid bruit, no JVD  and supple, symmetrical, trachea midline Lymph nodes: Cervical, supraclavicular, and axillary nodes normal. Heart:regular rate and rhythm, S1, S2 normal, no murmur, click, rub or gallop Lung:chest clear, no wheezing, rales, normal symmetric air entry. No dullness to percussion. Abdomin: soft, non-tender, without masses or organomegaly EXT:no erythema, induration, or nodules Skin: Clear without any rashes or lesions.  Lab Results: Lab Results  Component Value Date   WBC 9.0 11/09/2014   HGB 12.7* 11/09/2014   HCT 37.5* 11/09/2014   MCV 92.9 11/09/2014   PLT 336 11/09/2014     Chemistry      Component Value Date/Time   NA 140 11/09/2014 0928   K 4.2 11/09/2014 0928   CO2 23 11/09/2014 0928   BUN 9.5 11/09/2014 0928   CREATININE 0.8 11/09/2014 0928      Component Value Date/Time   CALCIUM 9.4 11/09/2014 0928   ALKPHOS 68 11/09/2014 0928   AST 21 11/09/2014 0928   ALT 20 11/09/2014 0928   BILITOT 0.30 11/09/2014 0928     Results for Kelly Garza (MRN [id number]) as of 11/16/2014 13:55  Ref. Range 11/09/2014 09:28  AFP Tumor Marker Latest Ref Range: <6.1 ng/mL 2.0  Beta hCG, Tumor Marker Latest Ref Range: < 5.0 mIU/mL < 2.0      EXAM: CT CHEST, ABDOMEN, AND PELVIS WITH CONTRAST  TECHNIQUE: Multidetector CT imaging of the chest, abdomen and pelvis was performed following the standard protocol during bolus administration of intravenous contrast.  CONTRAST: 151mL OMNIPAQUE IOHEXOL 300 MG/ML SOLN  COMPARISON: 08/25/2014  FINDINGS: CT CHEST FINDINGS  Mediastinum/Nodes: No supraclavicular adenopathy. Normal heart size, without pericardial effusion. No central pulmonary embolism, on this non-dedicated study.  Small stable middle mediastinal nodes. Similar borderline right hilar adenopathy, 1.0 cm on image 27. Left infrahilar nodal tissue is also similar and upper normal, including at 9 mm on image 31.  Right retrocrural node measures 7 mm today versus 10  mm on the prior.  Lungs/Pleura: No pleural fluid. Mild centrilobular and paraseptal emphysema.  Similar minimal subpleural nodularity in the right lower lobe, including on image 36.  Mild superior segment right lower lobe reticular nodular opacity on image 24 and peribronchovascular interstitial opacity on image 31. New.  Focal lucency at the left lung base is similar and favored to be post infectious or inflammatory. Nodularity along the posterior aspect of this area is less well-defined today. Maximally 1.0 cm image 52. Compare 12 mm on the prior.  Musculoskeletal: No acute osseous abnormality.  CT ABDOMEN PELVIS FINDINGS  Hepatobiliary: Normal liver. Normal gallbladder, without biliary ductal dilatation.  Pancreas: Normal, without mass or ductal dilatation.  Spleen: Normal in size, without focal abnormality.  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 Normal right adrenal gland and right kidney, without hydronephrosis or hydroureter. Normal urinary bladder.  Stomach/Bowel: Normal stomach, without wall thickening. Colon not identified within the right-sided abdomen. Normal small bowel caliber.  Vascular/Lymphatic: Normal caliber of the aorta and branch vessels. Decreased size of left external iliac and inguinal nodes, which are not pathologic by size criteria. No abdominopelvic adenopathy.  Reproductive: Normal prostate.  Other: No significant free fluid.  Musculoskeletal: No acute osseous abnormality.  IMPRESSION: 1. Similar  to decrease in size of thoracic and pelvic nodes. Not in the typical drainage pattern for testicular primary. This could represent decrease in reactive nodes or an atypical appearance of nodal metastasis, status post response to therapy. 2. No new or progressive disease identified. 3. 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 Absence of colon in the right-sided the abdomen may also be  developmental in nature. 4. Centrilobular and paraseptal emphysema with a more focal area of emphysema at the left lung base. This is favored to be due to a remote infectious or inflammatory insult. The area of previous described nodularity is less well-defined today.      Impression and Plan:   24 year old gentleman with the following issues:  1. 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 The pathology showed a focal LVI . His tumor markers were elevated with alpha-fetoprotein of 2038 and a beta-hCG of 942. Alpha-fetoprotein normalized postoperatively and his beta-hCG after a nadir of 18 was climbing up to 412.  Imaging studies including a CT scan on 08/25/2014 indicated  pulmonary nodules that could be suspicious for malignancy. His staging including stage IS versus stage IIIa.  He is status post 3 cycles of BEP completed on 10/26/2014. His tumor markers have normalized and CT scan was personally reviewed and discussed with the patient today. It does not really show any evidence of relapse disease. The plan is continue with active surveillance at the time being and I will check his tumor markers in 2 months and repeat imaging studies in 4 months.  2. Pulmonary toxicity prophylaxis: His pulmonary function tests showed a normal DLCO. Bleomycin therapy concluded without any delayed pulmonary symptoms.  3. Fertility issues: He has underwent sperm banking prior to his orchiectomy in March 2016.    4. Pain: Predominantly arthralgia and myalgia and he is hydrocodone and I gave him prescription today with instructions to taper his use down. He will use hydrocodone 3 times a day for the next 2 weeks then twice a day for the next 2 weeks and then once a day for the next 2 weeks and he and he will stop.  5. Insomnia: Uses  Ativan to help with nausea and insomnia this will also be tapered down moving forward.  6. Follow-up: Will be in 2 months to recheck his tumor markers.     [id number],MOLMB, MD 10/4/20162:25 PM

## 2014-11-16 NOTE — Telephone Encounter (Signed)
Gave patient avs report and appointments for December. No pof at time of check out. Per patient he is to return in 2 mos. Scheduled lab/fu for 12/6 according to lab order entered today for 12/6.

## 2014-11-30 ENCOUNTER — Telehealth: Payer: Self-pay | Admitting: *Deleted

## 2014-11-30 NOTE — Telephone Encounter (Signed)
Patient's mother candy calling to request a refill on pain medication. Per dr Alen Blew, no. Patient is no longer on treatment and it is not cancer related. He should see his primary care physician. Candy verbalized understanding.

## 2015-01-18 ENCOUNTER — Ambulatory Visit (HOSPITAL_BASED_OUTPATIENT_CLINIC_OR_DEPARTMENT_OTHER): Payer: Self-pay | Admitting: Oncology

## 2015-01-18 ENCOUNTER — Other Ambulatory Visit (HOSPITAL_BASED_OUTPATIENT_CLINIC_OR_DEPARTMENT_OTHER): Payer: Self-pay

## 2015-01-18 ENCOUNTER — Telehealth: Payer: Self-pay | Admitting: Oncology

## 2015-01-18 VITALS — BP 121/59 | HR 68 | Temp 98.4°F | Resp 18 | Ht 68.0 in | Wt 143.9 lb

## 2015-01-18 DIAGNOSIS — C6291 Malignant neoplasm of right testis, unspecified whether descended or undescended: Secondary | ICD-10-CM

## 2015-01-18 DIAGNOSIS — C6292 Malignant neoplasm of left testis, unspecified whether descended or undescended: Secondary | ICD-10-CM

## 2015-01-18 LAB — CBC WITH DIFFERENTIAL/PLATELET
BASO%: 1.4 % (ref 0.0–2.0)
BASOS ABS: 0.1 10*3/uL (ref 0.0–0.1)
EOS%: 5 % (ref 0.0–7.0)
Eosinophils Absolute: 0.3 10*3/uL (ref 0.0–0.5)
HCT: 44.2 % (ref 38.4–49.9)
HGB: 14.8 g/dL (ref 13.0–17.1)
LYMPH#: 1.4 10*3/uL (ref 0.9–3.3)
LYMPH%: 21 % (ref 14.0–49.0)
MCH: 31.5 pg (ref 27.2–33.4)
MCHC: 33.6 g/dL (ref 32.0–36.0)
MCV: 93.7 fL (ref 79.3–98.0)
MONO#: 0.5 10*3/uL (ref 0.1–0.9)
MONO%: 7.7 % (ref 0.0–14.0)
NEUT%: 64.9 % (ref 39.0–75.0)
NEUTROS ABS: 4.4 10*3/uL (ref 1.5–6.5)
Platelets: 296 10*3/uL (ref 140–400)
RBC: 4.71 10*6/uL (ref 4.20–5.82)
RDW: 12.3 % (ref 11.0–14.6)
WBC: 6.7 10*3/uL (ref 4.0–10.3)

## 2015-01-18 LAB — COMPREHENSIVE METABOLIC PANEL
ALBUMIN: 3.8 g/dL (ref 3.5–5.0)
ALK PHOS: 88 U/L (ref 40–150)
ALT: 34 U/L (ref 0–55)
AST: 26 U/L (ref 5–34)
Anion Gap: 9 mEq/L (ref 3–11)
BUN: 11.1 mg/dL (ref 7.0–26.0)
CALCIUM: 9.7 mg/dL (ref 8.4–10.4)
CHLORIDE: 104 meq/L (ref 98–109)
CO2: 27 mEq/L (ref 22–29)
Creatinine: 1.2 mg/dL (ref 0.7–1.3)
EGFR: 87 mL/min/{1.73_m2} — AB (ref >90)
GLUCOSE: 90 mg/dL (ref 70–140)
POTASSIUM: 4 meq/L (ref 3.5–5.1)
SODIUM: 140 meq/L (ref 136–145)
Total Bilirubin: <0.3 mg/dL (ref 0.20–1.20)
Total Protein: 7.1 g/dL (ref 6.4–8.3)

## 2015-01-18 LAB — LACTATE DEHYDROGENASE: LDH: 171 U/L (ref 125–245)

## 2015-01-18 NOTE — Telephone Encounter (Signed)
Gave and printed appt sched and avs for pt for Feb...gv barium

## 2015-01-18 NOTE — Progress Notes (Signed)
Hematology and Oncology Follow Up Visit  Kelly Garza [id number] Oct 21, 1990 24 y.o. 01/18/2015 3:10 PM No PCP Per PatientNo ref. 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 He had LV I an elevated tumor marker of alpha-fetoprotein is 2038 and beta-hCG of 942. Imaging studies which showed pulmonary nodules suspicious for stage IIIa disease.   Prior Therapy:  He is status post orchiectomy done on 04/19/2014.  BEP systemic chemotherapy started on 08/30/2014. He is S/P 3 cycles of BEP completed on 10/26/2014 with complete normalization of his tumor markers.  Current therapy: Observation and surveillance.  Interim History: Kelly Garza presents today for a follow-up visit by himself. Since the last visit, he has been doing very well without any recent complaints. He completed completed systemic chemotherapy and have fully recovered at this time. He does not report any arthralgias or myalgias. Has not reported any shortness of breath or difficulty breathing. Has not reported any cough or decline his energy. He has resumed work-related duties without decline.  He does not report any headaches, blurry vision, syncope or seizures. He does not report any fevers, chills, sweats or weight loss. He does not report any chest pain, palpitation, orthopnea or PND. He does not report any cough, wheezing, hemoptysis, shortness of breath, dyspnea on exertion or difficulty breathing. He does not report any nausea, vomiting, abdominal pain. He does not report any frequency, urgency, hesitancy or hematuria.  Does not report any mood disorder clearing anxiety or depression. Remaining review of systems unremarkable  Medications: I have reviewed the patient's current medications.  Current Outpatient Prescriptions  Medication Sig Dispense Refill   . albuterol (PROVENTIL HFA;VENTOLIN HFA) 108 (90 BASE) MCG/ACT inhaler Inhale into the lungs every 6 (six) hours as needed for wheezing or shortness of breath.    Marland Kitchen ibuprofen (ADVIL,MOTRIN) 200 MG tablet Take 200 mg by mouth every 6 (six) hours as needed.    . testosterone enanthate (DELATESTRYL) 200 MG/ML injection Inject 200 mg into the muscle every 14 (fourteen) days. For IM use only     No current facility-administered medications for this visit.     Allergies: No Known Allergies  Past Medical History, Surgical history, Social history, and Family History were reviewed and updated.   Physical Exam: Blood pressure 121/59, pulse 68, temperature 98.4 F (36.9 C), temperature source Oral, resp. rate 18, height 5\' 8"  (1.727 m), weight 143 lb 14.4 oz (65.273 kg), SpO2 100 %. ECOG: 0 General appearance: alert and cooperative healthy-appearing gentleman without distress. Head: Normocephalic, without obvious abnormality. No oral ulcers or lesions. Neck: no adenopathy, no carotid bruit, no JVD and supple, symmetrical, trachea midline Lymph nodes: Cervical, supraclavicular, and axillary nodes normal. Heart:regular rate and rhythm, S1, S2 normal, no murmur, click, rub or gallop Lung:chest clear, no wheezing, rales, normal symmetric air entry.  Abdomin: soft, non-tender, without masses or organomegaly no shifting dullness or ascites. EXT:no erythema, induration, or nodules Skin: Clear without any rashes or lesions.  Lab Results: Lab Results  Component Value Date   WBC 6.7 01/18/2015   HGB 14.8 01/18/2015   HCT 44.2 01/18/2015   MCV 93.7 01/18/2015   PLT 296 01/18/2015     Chemistry      Component Value Date/Time   NA 140 11/09/2014 0928   K 4.2 11/09/2014 0928   CO2 23 11/09/2014 0928  BUN 9.5 11/09/2014 0928   CREATININE 0.8 11/09/2014 0928      Component Value Date/Time   CALCIUM 9.4 11/09/2014 0928   ALKPHOS 68 11/09/2014 0928   AST 21 11/09/2014 0928   ALT 20 11/09/2014  0928   BILITOT 0.30 11/09/2014 0928     Results for Kelly Garza, Kelly Garza (MRN [id number]) as of 11/16/2014 13:55  Ref. Range 11/09/2014 09:28  AFP Tumor Marker Latest Ref Range: <6.1 ng/mL 2.0  Beta hCG, Tumor Marker Latest Ref Range: < 5.0 mIU/mL < 2.0        Impression and Plan:   24 year old gentleman with the following issues:  1. 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 The pathology showed a focal LVI . His tumor markers were elevated with alpha-fetoprotein of 2038 and a beta-hCG of 942. Alpha-fetoprotein normalized postoperatively and his beta-hCG after a nadir of 18 was climbing up to 412.  Imaging studies including a CT scan on 08/25/2014 indicated  pulmonary nodules that could be suspicious for malignancy. His staging including stage IS versus stage IIIa.  He is status post 3 cycles of BEP completed on 10/26/2014. His tumor markers have normalized and CT scan in September 2016 showed no evidence of measurable disease. The plan is continued active surveillance and repeat laboratory testing, tumor markers and CT scan in 2 months.  2. Pulmonary toxicity prophylaxis: His pulmonary function tests showed a normal DLCO. No delayed complications related to bleomycin. He has no lung symptoms.  3. Fertility issues: He has underwent sperm banking prior to his orchiectomy in March 2016.    4. Pain: Predominantly arthralgia and myalgia resolved at this time.  5. Follow-up: Will be in 2 months for repeat her tumor markers and laboratory testing.     [id number], MD 12/6/20163:10 PM

## 2015-01-21 LAB — BETA HCG QUANT (REF LAB): Beta hCG, Tumor Marker: 3.2 m[IU]/mL (ref <5.0)

## 2015-01-21 LAB — AFP TUMOR MARKER: AFP TUMOR MARKER: 1.6 ng/mL (ref <6.1)

## 2015-03-23 ENCOUNTER — Other Ambulatory Visit (HOSPITAL_BASED_OUTPATIENT_CLINIC_OR_DEPARTMENT_OTHER): Payer: Self-pay

## 2015-03-23 ENCOUNTER — Encounter (HOSPITAL_COMMUNITY): Payer: Self-pay

## 2015-03-23 ENCOUNTER — Ambulatory Visit (HOSPITAL_COMMUNITY)
Admission: RE | Admit: 2015-03-23 | Discharge: 2015-03-23 | Disposition: A | Discharge status: Home / Self Care | Payer: Self-pay | Source: Ambulatory Visit | Attending: Oncology | Admitting: Oncology

## 2015-03-23 DIAGNOSIS — C6292 Malignant neoplasm of left testis, unspecified whether descended or undescended: Secondary | ICD-10-CM | POA: Insufficient documentation

## 2015-03-23 DIAGNOSIS — C6291 Malignant neoplasm of right testis, unspecified whether descended or undescended: Secondary | ICD-10-CM

## 2015-03-23 LAB — COMPREHENSIVE METABOLIC PANEL
ALK PHOS: 81 U/L (ref 40–150)
ALT: 36 U/L (ref 0–55)
AST: 28 U/L (ref 5–34)
Albumin: 3.8 g/dL (ref 3.5–5.0)
Anion Gap: 10 mEq/L (ref 3–11)
BUN: 12 mg/dL (ref 7.0–26.0)
CHLORIDE: 106 meq/L (ref 98–109)
CO2: 26 meq/L (ref 22–29)
CREATININE: 0.9 mg/dL (ref 0.7–1.3)
Calcium: 9.1 mg/dL (ref 8.4–10.4)
GLUCOSE: 58 mg/dL — AB (ref 70–140)
Potassium: 4.2 mEq/L (ref 3.5–5.1)
SODIUM: 142 meq/L (ref 136–145)
TOTAL PROTEIN: 6.7 g/dL (ref 6.4–8.3)
Total Bilirubin: 0.38 mg/dL (ref 0.20–1.20)

## 2015-03-23 LAB — CBC WITH DIFFERENTIAL/PLATELET
BASO%: 1.2 % (ref 0.0–2.0)
Basophils Absolute: 0.1 10*3/uL (ref 0.0–0.1)
EOS%: 4.3 % (ref 0.0–7.0)
Eosinophils Absolute: 0.2 10*3/uL (ref 0.0–0.5)
HCT: 40.4 % (ref 38.4–49.9)
HGB: 13.5 g/dL (ref 13.0–17.1)
LYMPH%: 23.2 % (ref 14.0–49.0)
MCH: 29.9 pg (ref 27.2–33.4)
MCHC: 33.5 g/dL (ref 32.0–36.0)
MCV: 89.3 fL (ref 79.3–98.0)
MONO#: 0.4 10*3/uL (ref 0.1–0.9)
MONO%: 6.4 % (ref 0.0–14.0)
NEUT%: 64.9 % (ref 39.0–75.0)
NEUTROS ABS: 3.6 10*3/uL (ref 1.5–6.5)
Platelets: 276 10*3/uL (ref 140–400)
RBC: 4.52 10*6/uL (ref 4.20–5.82)
RDW: 13.3 % (ref 11.0–14.6)
WBC: 5.5 10*3/uL (ref 4.0–10.3)
lymph#: 1.3 10*3/uL (ref 0.9–3.3)

## 2015-03-23 LAB — LACTATE DEHYDROGENASE: LDH: 178 U/L (ref 125–245)

## 2015-03-23 MED ORDER — IOHEXOL 300 MG/ML  SOLN
100.0000 mL | Freq: Once | INTRAMUSCULAR | Status: AC | PRN
  Administered 2015-03-23: 100 mL via INTRAVENOUS

## 2015-03-24 LAB — AFP TUMOR MARKER: AFP, Serum, Tumor Marker: 1.9 ng/mL (ref 0.0–8.3)

## 2015-03-24 LAB — BETA HCG QUANT (REF LAB)

## 2015-03-26 LAB — BETA HCG, QN (PARALLEL TESTING): Beta hCG, Tumor Marker: <2 m[IU]/mL (ref <5.0)

## 2015-03-26 LAB — ALPHA FETO PROTEIN (PARALLEL TESTING): AFP-Tumor Marker: 1.4 ng/mL (ref <6.1)

## 2015-03-29 ENCOUNTER — Ambulatory Visit (HOSPITAL_BASED_OUTPATIENT_CLINIC_OR_DEPARTMENT_OTHER): Payer: Self-pay | Admitting: Oncology

## 2015-03-29 ENCOUNTER — Telehealth: Payer: Self-pay | Admitting: Oncology

## 2015-03-29 VITALS — BP 113/65 | HR 69 | Temp 98.0°F | Resp 18 | Ht 68.0 in | Wt 133.1 lb

## 2015-03-29 DIAGNOSIS — C6292 Malignant neoplasm of left testis, unspecified whether descended or undescended: Secondary | ICD-10-CM

## 2015-03-29 DIAGNOSIS — C6291 Malignant neoplasm of right testis, unspecified whether descended or undescended: Secondary | ICD-10-CM

## 2015-03-29 DIAGNOSIS — C6290 Malignant neoplasm of unspecified testis, unspecified whether descended or undescended: Secondary | ICD-10-CM

## 2015-03-29 MED ORDER — IBUPROFEN 200 MG PO TABS: 200.0000 mg | ORAL_TABLET | Freq: Four times a day (QID) | ORAL | Status: DC | PRN

## 2015-03-29 MED ORDER — NICOTINE 14 MG/24HR TD PT24: 14.0000 mg | MEDICATED_PATCH | Freq: Every day | TRANSDERMAL | Status: DC

## 2015-03-29 NOTE — Progress Notes (Signed)
Hematology and Oncology Follow Up Visit  Kelly Garza [id number] 1990/12/03 25 y.o. 03/29/2015 12:56 PM No PCP Per PatientNo ref. 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 He had LV I an elevated tumor marker of alpha-fetoprotein is 2038 and beta-hCG of 942. Imaging studies which showed pulmonary nodules suspicious for stage IIIa disease.   Prior Therapy:  He is status post orchiectomy done on 04/19/2014.  BEP systemic chemotherapy started on 08/30/2014. He is S/P 3 cycles of BEP completed on 10/26/2014 with complete normalization of his tumor markers.  Current therapy: Observation and surveillance.  Interim History: Kelly Garza presents today for a follow-up visit by himself. Since the last visit, he reports no major changes in his health. He denies any delayed complications related to chemotherapy. They have resumed all work related uties. He does report some occasional headaches and takes have a pro-with resolution.   He does not report any arthralgias or myalgias. Has not reported any shortness of breath or difficulty breathing. Has not reported any cough or decline his energy. He has resumed smoking and would like to quit at this time.  He does not report any headaches, blurry vision, syncope or seizures. He does not report any fevers, chills, sweats or weight loss. He does not report any chest pain, palpitation, orthopnea or PND. He does not report any cough, wheezing, hemoptysis, shortness of breath, dyspnea on exertion or difficulty breathing. He does not report any nausea, vomiting, abdominal pain. He does not report any frequency, urgency, hesitancy or hematuria.  Does not report any mood disorder clearing anxiety or depression. Remaining review of systems unremarkable  Medications: I have reviewed the  patient's current medications.  Current Outpatient Prescriptions  Medication Sig Dispense Refill  . albuterol (PROVENTIL HFA;VENTOLIN HFA) 108 (90 BASE) MCG/ACT inhaler Inhale into the lungs every 6 (six) hours as needed for wheezing or shortness of breath.    Marland Kitchen ibuprofen (ADVIL,MOTRIN) 200 MG tablet Take 1 tablet (200 mg total) by mouth every 6 (six) hours as needed. 30 tablet 1  . nicotine (NICODERM CQ - DOSED IN MG/24 HOURS) 14 mg/24hr patch Place 1 patch (14 mg total) onto the skin daily. 28 patch 0  . testosterone enanthate (DELATESTRYL) 200 MG/ML injection Inject 200 mg into the muscle every 14 (fourteen) days. For IM use only     No current facility-administered medications for this visit.     Allergies: No Known Allergies  Past Medical History, Surgical history, Social history, and Family History were reviewed and updated.   Physical Exam: Blood pressure 113/65, pulse 69, temperature 98 F (36.7 C), temperature source Oral, resp. rate 18, height 5\' 8"  (1.727 m), weight 133 lb 1.6 oz (60.374 kg), SpO2 100 %. ECOG: 0 General appearance: alert and cooperative not in any distress. Head: Normocephalic, without obvious abnormality. No oral ulcers or lesions. Neck: no adenopathy or thyroid masses. Lymph nodes: Cervical, supraclavicular, and axillary nodes normal. Heart:regular rate and rhythm, S1, S2 normal, no murmur, click, rub or gallop Lung:chest clear, no wheezing, rales, normal symmetric air entry.  Abdomin: soft, non-tender, without masses or organomegaly no rebound or guarding. EXT:no erythema, induration, or nodules Skin: Clear without any rashes or lesions.  Lab Results: Lab Results  Component Value Date   WBC 5.5 03/23/2015   HGB 13.5 03/23/2015   HCT 40.4 03/23/2015  MCV 89.3 03/23/2015   PLT 276 03/23/2015     Chemistry      Component Value Date/Time   NA 142 03/23/2015 1304   K 4.2 03/23/2015 1304   CO2 26 03/23/2015 1304   BUN 12.0 03/23/2015 1304    CREATININE 0.9 03/23/2015 1304      Component Value Date/Time   CALCIUM 9.1 03/23/2015 1304   ALKPHOS 81 03/23/2015 1304   AST 28 03/23/2015 1304   ALT 36 03/23/2015 1304   BILITOT 0.38 03/23/2015 1304     Results for Kelly Garza, Kelly Garza (MRN [id number]) as of 03/29/2015 12:44  Ref. Range 03/23/2015 13:05  AFP Tumor Marker Latest Ref Range: <6.1 ng/mL 1.4  Beta hCG, Tumor Marker Latest Ref Range: < 5.0 mIU/mL < 2.0    EXAM: CT CHEST, ABDOMEN, AND PELVIS WITH CONTRAST  TECHNIQUE: Multidetector CT imaging of the chest, abdomen and pelvis was performed following the standard protocol during bolus administration of intravenous contrast.  CONTRAST: 115mL OMNIPAQUE IOHEXOL 300 MG/ML SOLN  COMPARISON: 11/09/2014  FINDINGS: CT CHEST FINDINGS  Mediastinum/Lymph Nodes: No masses, pathologically enlarged lymph nodes, or other significant abnormality.  Lungs/Pleura: Pulmonary emphysema again demonstrated with large bleb again seen in the left lower lobe. No evidence of pneumothorax or pleural effusion. No suspicious pulmonary nodules or masses identified. No evidence of pulmonary infiltrate.  Musculoskeletal: No chest wall mass or suspicious bone lesions identified.  CT ABDOMEN PELVIS FINDINGS  Hepatobiliary: No masses or other significant abnormality. Gallbladder is unremarkable.  Pancreas: No mass, inflammatory changes, or other significant abnormality.  Spleen: Within normal limits in size and appearance.  Adrenals/Urinary Tract: No masses identified. No evidence of hydronephrosis. Absent left kidney again noted.  Stomach/Bowel: No evidence of obstruction, inflammatory process, or abnormal fluid collections.  Vascular/Lymphatic: No pathologically enlarged lymph nodes. No evidence of abdominal aortic aneursym.  Reproductive: No mass or other significant abnormality.  Other: None.  Musculoskeletal: No suspicious bone lesions  identified.  IMPRESSION: Stable exam. No evidence of metastatic disease or other acute findings within the chest, abdomen, or pelvis     Impression and Plan:   25 year old gentleman with the following issues:  1. 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 The pathology showed a focal LVI . His tumor markers were elevated with alpha-fetoprotein of 2038 and a beta-hCG of 942. Alpha-fetoprotein normalized postoperatively and his beta-hCG after a nadir of 18 was climbing up to 412.  Imaging studies including a CT scan on 08/25/2014 indicated  pulmonary nodules that could be suspicious for malignancy. His staging including stage IS versus stage IIIa.  He is status post 3 cycles of BEP completed on 10/26/2014 with complete normalization of his tumor markers and normal CT scan. He is currently on active surveillance at this time.  His laboratory data including tumor markers and CT scan obtained on 03/23/2015 were reviewed and showed no evidence of recurrent disease. He continues to have normal markers at this time and the plan is to continue with active surveillance. I will repeat laboratory testing in 3 months and repeat imaging studies in 6 months. This will be maintained until he is 2 years out from the conclusion of chemotherapy. Imaging studies will be done yearly after that.    2. Pulmonary toxicity prophylaxis: His pulmonary function tests showed a normal DLCO. No delayed  complications related to bleomycin. I continue to advise him about smoking cessation strategies and I will prescribe him a nicotine patch.  3. Fertility issues: He has underwent sperm banking prior to his orchiectomy in March 2016.    4. Pain: Predominantly arthralgia and myalgia resolved at this time.  5. Follow-up: Will be in 3 months for  laboratory testing only and in 6 months for CT scan.     [facility], MD 2/14/201712:56 PM

## 2015-03-29 NOTE — Telephone Encounter (Signed)
per pof to sch pt appt-gave pt copy of avs °

## 2015-03-29 NOTE — Addendum Note (Signed)
Addended by: Randolm Idol on: 03/29/2015 01:33 PM   Modules accepted: Medications

## 2015-04-01 ENCOUNTER — Other Ambulatory Visit: Payer: Self-pay | Admitting: *Deleted

## 2015-06-28 ENCOUNTER — Other Ambulatory Visit: Payer: Self-pay

## 2015-06-28 ENCOUNTER — Ambulatory Visit: Payer: Self-pay | Admitting: Oncology

## 2016-02-10 ENCOUNTER — Other Ambulatory Visit: Payer: Self-pay | Admitting: Nurse Practitioner

## 2017-06-13 IMAGING — CT CT ABD-PELV W/ CM
2 of 4 series · 14 of 46 positions shown, 16 images · IV contrast (OMNIPAQUE)
Comparison: No priors.

CLINICAL DATA: Subsequent evaluation of a 24-year-old male with
history of testicular cancer diagnosed in April 2014 status post
left orchiectomy. Evaluate for metastatic disease.

EXAM:
CT CHEST, ABDOMEN, AND PELVIS WITH CONTRAST
TECHNIQUE: Multidetector CT imaging of the chest, abdomen and pelvis was
performed following the standard protocol during bolus
administration of intravenous contrast.
CONTRAST:  100mL OMNIPAQUE IOHEXOL 300 MG/ML  SOLN

[Series 2: cap with st · axial · 0.73mm/px · z∈[-613,-23]mm · 11 of 132 slices shown, 13 images]
[im 7/132  soft-tissue]
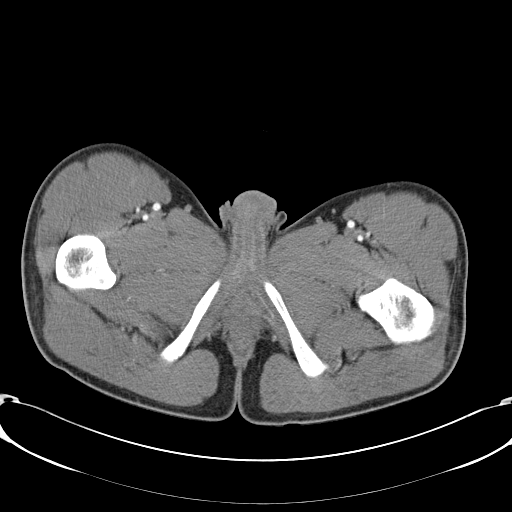
[im 7/132  bone]
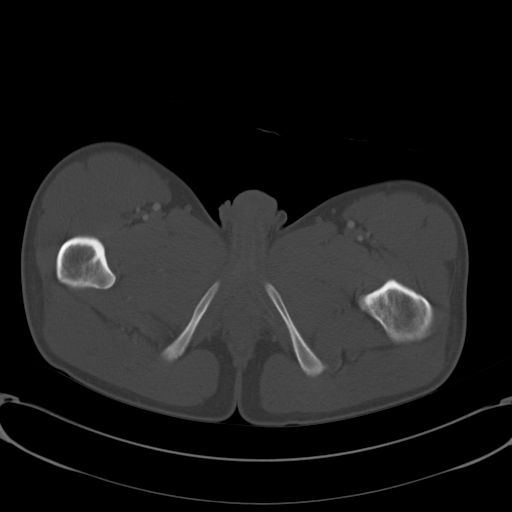
[im 19/132  soft-tissue]
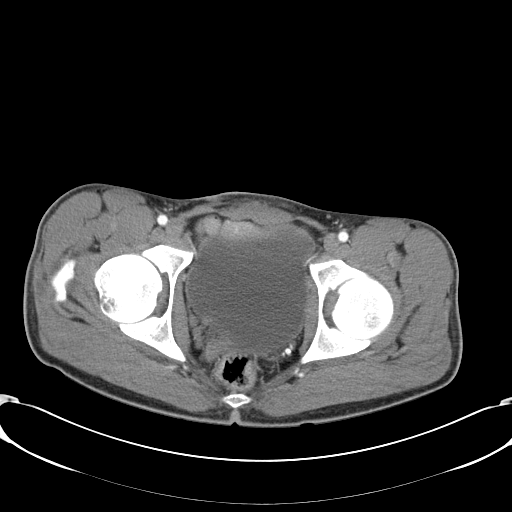
[im 32/132  soft-tissue]
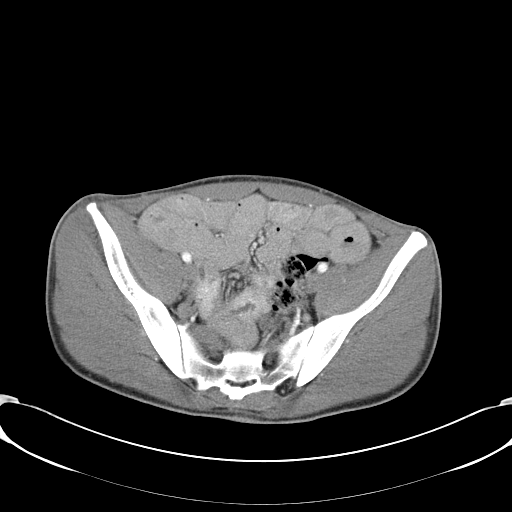
[im 44/132  soft-tissue]
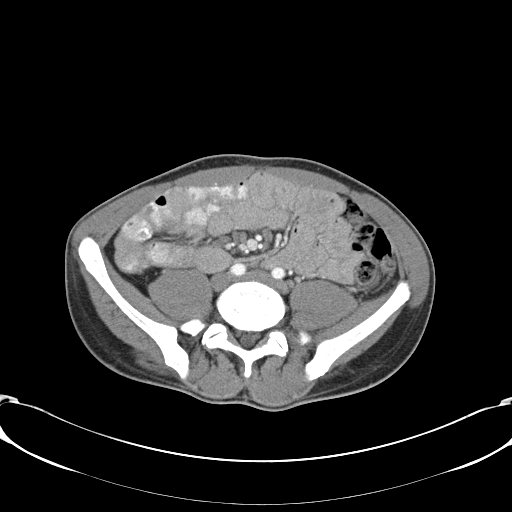
[im 57/132  soft-tissue]
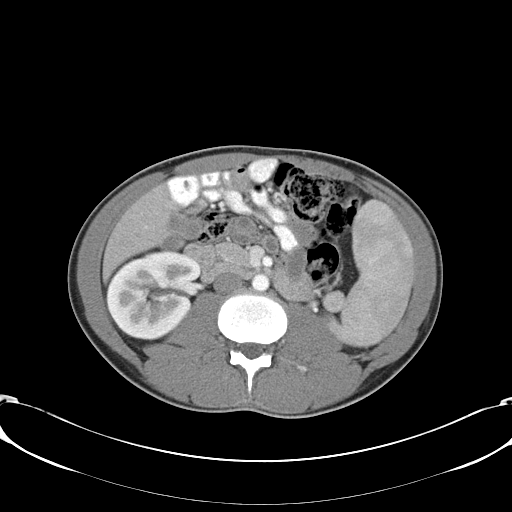
[im 69/132  soft-tissue]
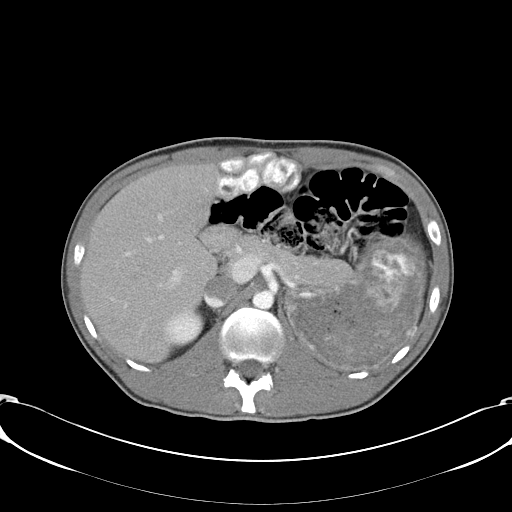
[im 75/132  soft-tissue]
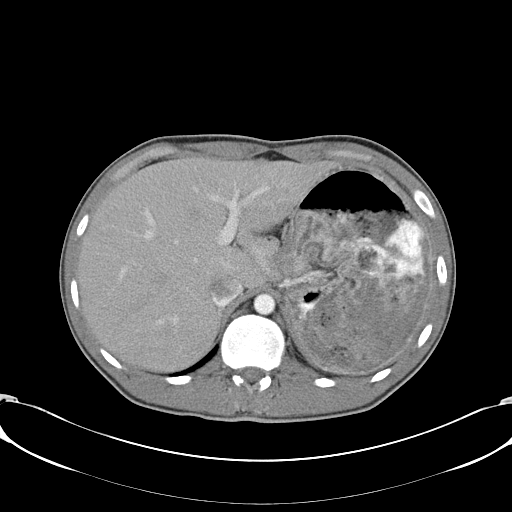
[im 88/132  soft-tissue]
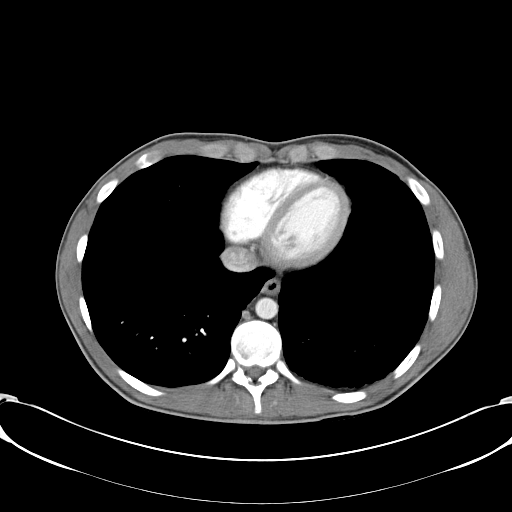
[im 100/132  soft-tissue]
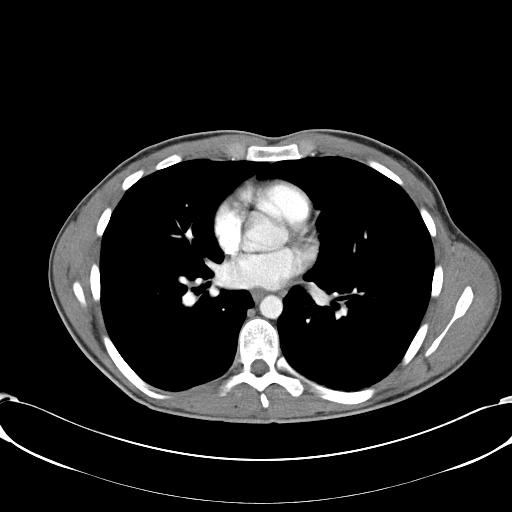
[im 100/132  bone]
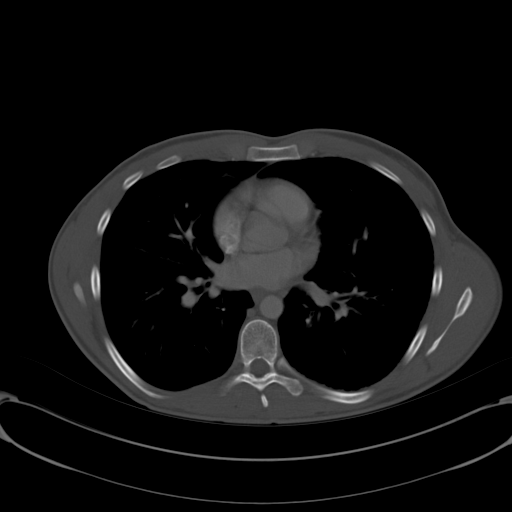
[im 113/132  soft-tissue]
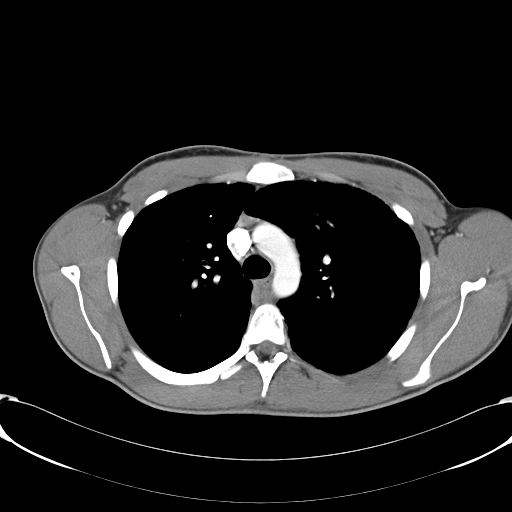
[im 125/132  soft-tissue]
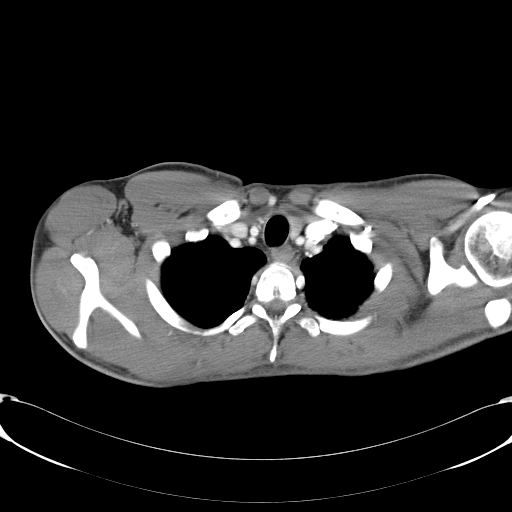

[Series 602: <mpr thick range> · coronal · 1.28mm/px · 3 of 75 slices shown]
[im 25/75  soft-tissue]
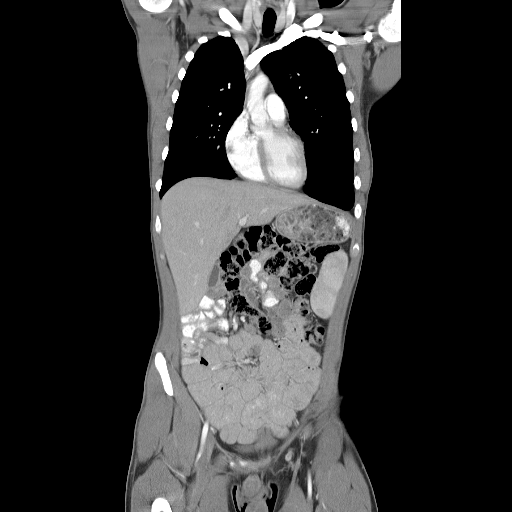
[im 33/75  soft-tissue]
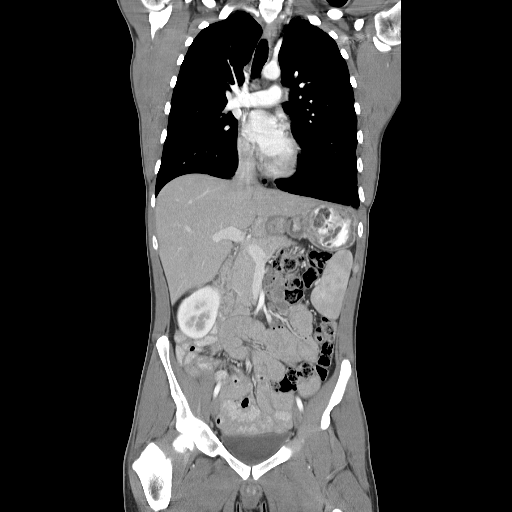
[im 42/75  soft-tissue]
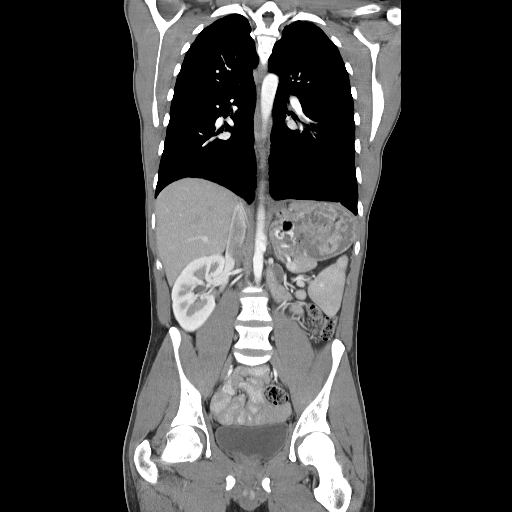

[14 of 46 positions shown; findings below may reference images not displayed]

FINDINGS: CT CHEST FINDINGS

Mediastinum/Lymph Nodes: Heart size is normal. There is no
significant pericardial fluid, thickening or pericardial
calcification. Borderline enlarged right hilar lymph node measuring
9 mm in short axis is nonspecific. Mildly enlarged retrocrural lymph
nodes measuring up to 1 cm in short axis on the right. Esophagus is
unremarkable in appearance. No axillary lymphadenopathy.

Lungs/Pleura: Paraseptal emphysema is noted in the lungs bilaterally
(left greater than right), generally mild, with exception of the
lung bases. In particular, in the left lung base there are large
subpleural bullae. Along the posterior margin of the largest bulla
in the wall inferior left hemithorax associated with the inferior
aspect of left lower lobe there is a macrolobulated 12 x 6 mm
nodule. A few other scattered tiny 2-3 mm pulmonary nodules are also
noted, predominantly in a subpleural distribution, nonspecific and
favored to represent subpleural lymph nodes, largest of which
measures 3 mm (image 18 of series 4) in the posterior aspect of the
left upper lobe. No acute consolidative airspace disease. No pleural
effusions.

Musculoskeletal/Soft Tissues: There are no aggressive appearing
lytic or blastic lesions noted in the visualized portions of the
skeleton.

CT ABDOMEN AND PELVIS FINDINGS

Hepatobiliary: No cystic or solid hepatic lesions. No intra or
extrahepatic biliary ductal dilatation. Gallbladder is normal in
appearance.

Pancreas: No pancreatic mass. No pancreatic ductal dilatation. No
pancreatic or peripancreatic fluid or inflammatory changes.

Spleen: Small splenule adjacent to the splenic hilum. Otherwise,
unremarkable.

Adrenals/Urinary Tract: Left kidney and left adrenal gland are not
visualized, and could be surgically absent (although there are no
surgical clips or other surgical footprint in the left
retroperitoneum) or congenitally absent (favored). Right adrenal
gland and right kidney are normal in appearance. No
hydroureteronephrosis. Urinary bladder is normal in appearance.

Stomach/Bowel: Normal appearance of the stomach. No pathologic
dilatation of small bowel or colon.

Vascular/Lymphatic: No significant atherosclerotic disease, aneurysm
or dissection identified in the abdominal or pelvic vasculature.
Normal left renal artery origin not visualized. Borderline enlarged
left inguinal and common femoral lymph nodes noted, measuring up to
9 mm in short axis. No other definite lymphadenopathy noted in the
abdomen or pelvis.

Reproductive: Prostate gland and seminal vesicles are unremarkable
in appearance. Postoperative changes of left orchiectomy
incompletely visualized.

Other: No significant volume of ascites.  No pneumoperitoneum.

Musculoskeletal: There are no aggressive appearing lytic or blastic
lesions noted in the visualized portions of the skeleton.
IMPRESSION: 1. Prominent borderline enlarged left inguinal and left common
femoral lymph nodes, as well as mildly enlarged retrocrural lymph
nodes, and a borderline enlarged right hilar lymph node. These
findings are nonspecific, but metastatic disease is not excluded.
Further evaluation with PET-CT is suggested at this time.
2. In addition, there is a 1.2 x 0.6 cm macrolobulated pulmonary
nodule in the inferior aspect of the left lower lobe. This is highly
nonspecific, and is favored to represent an area of chronically
scarred lung parenchyma adjacent to the large bullae in the base of
the left lower lobe. However, attention at time of follow-up PET-CT
is suggested to exclude hypermetabolism, as an isolated metastatic
lesion is not excluded. Other tiny pulmonary nodules in lungs
bilaterally are favored to represent subpleural lymph nodes but
warrant continued attention on future follow-up imaging.
3. Probable left renal/adrenal agenesis.
4. Unusual appearance of the left lung, with generalized mild
paraseptal emphysema, which is far more pronounced at the left lung
base where there are several large bullae. The findings at the left
lung base could be explained by the presence of bronchial atresia
leading up to the affected segment of lung, however, this would not
account for the mild generalized paraseptal emphysema in the left
lung. Pulmonology referral could be considered for further
evaluation.

## 2021-03-21 ENCOUNTER — Telehealth: Payer: Self-pay | Admitting: Oncology

## 2021-03-21 NOTE — Telephone Encounter (Signed)
Scheduled appt per 2/7 staff msg from [employer]. Spoke to pt's mother, Freida Busman, who is aware of appt date and time. She is aware to have pt arrive 20 mins prior to appt time.

## 2021-04-06 ENCOUNTER — Inpatient Hospital Stay: Payer: 59 | Attending: Oncology | Admitting: Oncology

## 2021-04-06 ENCOUNTER — Other Ambulatory Visit: Payer: Self-pay

## 2021-04-06 VITALS — BP 106/60 | HR 58 | Temp 98.1°F | Resp 20 | Wt 132.8 lb

## 2021-04-06 DIAGNOSIS — R160 Hepatomegaly, not elsewhere classified: Secondary | ICD-10-CM

## 2021-04-06 DIAGNOSIS — Z8547 Personal history of malignant neoplasm of testis: Secondary | ICD-10-CM | POA: Diagnosis not present

## 2021-04-06 DIAGNOSIS — E291 Testicular hypofunction: Secondary | ICD-10-CM

## 2021-04-06 DIAGNOSIS — C6292 Malignant neoplasm of left testis, unspecified whether descended or undescended: Secondary | ICD-10-CM

## 2021-04-06 DIAGNOSIS — K769 Liver disease, unspecified: Secondary | ICD-10-CM

## 2021-04-06 NOTE — Progress Notes (Signed)
Reason for the request:    Testicular cancer, liver lesions.  HPI: I was asked by Dr. Roslyn Smiling to evaluate Kelly Garza for evaluation of testicular cancer.  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  At that time he presented with a right testicular mass and with pulmonary nodule suspicion for stage IIIa disease.  His alpha-fetoprotein was 2038 and beta-hCG was 942.  He underwent orchiectomy found to have a T2 tumor with 65% embryonal and 25% total total.  He completed orchiectomy on April 19, 2014 and completed 3 cycles of BEP chemotherapy in September 2016.  He had normalization of his tumor markers at that time.  Follow-up imaging studies of the chest and February 2017 showed no evidence of metastatic disease.  He establish care and was following at [facility] including urology and hematology oncology.  He was seen recently by Dr. Roslyn Smiling in January 2022 for concerns for possible hepatic lesions.  At that time his tumor markers were all within normal range.  CT scan of the abdomen and pelvis obtained on 02/07/2021 showed multiple ill-defined hypoenhancing lesions involving both lobes of the liver although precise characterization is limited and a liver MRI was recommended.  Patient opted to return follow-up at [facility].  Clinically, he reports feeling well without any major complaints.  He denies any nausea, vomiting or abdominal pain.  He denies any weight loss or appetite changes.  Continues to work full-time without any decline in ability usual.  He does not report any headaches, blurry vision, syncope or seizures. Does not report any fevers, chills or sweats.  Does not report any cough, wheezing or hemoptysis.  Does not report any chest pain, palpitation, orthopnea or leg edema.  Does not report any nausea, vomiting or abdominal pain.  Does not report any constipation or diarrhea.  Does not report any skeletal complaints.    Does not report  frequency, urgency or hematuria.  Does not report any skin rashes or lesions. Does not report any heat or cold intolerance.  Does not report any lymphadenopathy or petechiae.  Does not report any anxiety or depression.  Remaining review of systems is negative.     Past Medical History:  Diagnosis Date   Testis cancer ([place])   :  No past surgical history on file.:   Current Outpatient Medications:    albuterol (PROVENTIL HFA;VENTOLIN HFA) 108 (90 BASE) MCG/ACT inhaler, Inhale into the lungs every 6 (six) hours as needed for wheezing or shortness of breath., Disp: , Rfl:    ibuprofen (ADVIL,MOTRIN) 200 MG tablet, Take 1 tablet (200 mg total) by mouth every 6 (six) hours as needed., Disp: 30 tablet, Rfl: 1   nicotine (NICODERM CQ - DOSED IN MG/24 HOURS) 14 mg/24hr patch, Place 1 patch (14 mg total) onto the skin daily., Disp: 28 patch, Rfl: 0   testosterone enanthate (DELATESTRYL) 200 MG/ML injection, Inject 200 mg into the muscle every 14 (fourteen) days. For IM use only, Disp: , Rfl: :  No Known Allergies:  No family history on file.:   Social History   Socioeconomic History   Marital status: Single    Spouse name: Not on file   Number of children: Not on file   Years of education: Not on file   Highest education level: Not on file  Occupational History   Not on file  Tobacco Use   Smoking status: Not on file   Smokeless tobacco: Not on file  Substance and Sexual Activity   Alcohol use: Not on file   Drug use: Not on file   Sexual activity: Not on file  Other Topics Concern   Not on file  Social History Narrative   Not on file   Social Determinants of Health   Financial Resource Strain: Not on file  Food Insecurity: Not on file  Transportation Needs: Not on file  Physical Activity: Not on file  Stress: Not on file  Social Connections: Not on file  Intimate Partner Violence: Not on file  :  Pertinent items are noted in HPI.  Exam: Blood pressure 106/60, pulse  (!) 58, temperature 98.1 F (36.7 C), resp. rate 20, weight 132 lb 12.8 oz (60.2 kg), SpO2 99 %. ECOG 0 General appearance: alert and cooperative appeared without distress. Head: atraumatic without any abnormalities. Eyes: conjunctivae/corneas clear. PERRL.  Sclera anicteric. Throat: lips, mucosa, and tongue normal; without oral thrush or ulcers. Resp: clear to auscultation bilaterally without rhonchi, wheezes or dullness to percussion. Cardio: regular rate and rhythm, S1, S2 normal, no murmur, click, rub or gallop GI: soft, non-tender; bowel sounds normal; no masses,  no organomegaly Skin: Skin color, texture, turgor normal. No rashes or lesions Lymph nodes: Cervical, supraclavicular, and axillary nodes normal. Neurologic: Grossly normal without any motor, sensory or deep tendon reflexes. Musculoskeletal: No joint deformity or effusion.    Assessment and Plan:   31 year old with:  1.  Testicular cancer diagnosed in 2016.  He was found to have T2 tumor with elevated tumor markers and questionable pulmonary metastasis indicating possibly stage IIIa versus stage IS disease.  He received 3 cycles of BEP chemotherapy with normalization of his tumor markers and imaging studies subsequently in 2017.  His follow-up has been erratic and most recent imaging studies and December 2022 showed hepatic lesions although this tumor markers are normal.  The natural course of this disease was reviewed at this time and treatment choices were discussed.  If this is indeed metastatic testicular cancer to the liver salvage option including systemic chemotherapy is his only option.  Multiple regimens have been discussed including TIP (paclitaxel, cisplatin and ifosfamide) versus a VIP regimen.  Complication associated with these regimens were reviewed and the need for Port-A-Cath insertion and possible hospitalization to receive inpatient chemotherapy were discussed.  None of these interventions will be needed if  his imaging studies and/or biopsy is not diagnostic of testicular cancer.  Before instituting any therapy at this time obtaining tissue biopsy is a most before proceeding any therapy.  We will arrange for hepatic MRI as well as biopsy he will follow-up after that.   He is agreeable with this plan.  2.  Hepatic lesions: Differential diagnosis includes testicular cancer metastasis, different cancer metastasis versus other benign etiologies including cysts.  We will arrange for MRI of the liver as well as biopsy in the near future.  3.  Testosterone deficiency: He is receiving testosterone injections and will refer to urology for the management of testosterone deficiency.  4.  Follow-up: We will be in the near future after obtaining imaging studies and a biopsy.    60  minutes were dedicated to this visit. The time was spent on reviewing laboratory data, imaging studies, discussing treatment options, discussing differential diagnosis and answering questions regarding future plan.     A copy of this consult has been forwarded to the requesting physician.

## 2021-04-14 ENCOUNTER — Telehealth: Payer: Self-pay | Admitting: Oncology

## 2021-04-14 NOTE — Telephone Encounter (Signed)
Scheduled per 02/23 los, patient has been called and voicemail was left. ?

## 2021-04-20 ENCOUNTER — Ambulatory Visit (HOSPITAL_COMMUNITY)
Admission: RE | Admit: 2021-04-20 | Discharge: 2021-04-20 | Disposition: A | Discharge status: Home / Self Care | Payer: 59 | Source: Ambulatory Visit | Attending: Oncology | Admitting: Oncology

## 2021-04-20 ENCOUNTER — Other Ambulatory Visit: Payer: Self-pay | Admitting: Oncology

## 2021-04-20 ENCOUNTER — Other Ambulatory Visit: Payer: Self-pay

## 2021-04-20 ENCOUNTER — Ambulatory Visit (HOSPITAL_COMMUNITY): Payer: 59

## 2021-04-20 DIAGNOSIS — K769 Liver disease, unspecified: Secondary | ICD-10-CM | POA: Diagnosis present

## 2021-04-20 DIAGNOSIS — R160 Hepatomegaly, not elsewhere classified: Secondary | ICD-10-CM

## 2021-04-20 DIAGNOSIS — C6292 Malignant neoplasm of left testis, unspecified whether descended or undescended: Secondary | ICD-10-CM

## 2021-04-20 MED ORDER — GADOBUTROL 1 MMOL/ML IV SOLN
6.0000 mL | Freq: Once | INTRAVENOUS | Status: AC | PRN
  Administered 2021-04-20: 08:00:00 6 mL via INTRAVENOUS

## 2021-04-20 NOTE — Progress Notes (Signed)
The results of the MRI were personally reviewed, discussed with radiology as well as with the patient's mother via phone today.  The hepatic lesions remain very subtle and less likely to be malignant nature.  The location is not amendable to biopsy and per radiology recommendation repeat imaging studies in 3 months is recommended.  This was discussed today and they are in agreement.  We will arrange for repeat MRI in 3 months. ?

## 2021-04-21 ENCOUNTER — Telehealth: Payer: Self-pay | Admitting: Oncology

## 2021-04-21 NOTE — Telephone Encounter (Signed)
.  Called pt per 3/9 inbasket , Patient was unavailable, a message with appt time and date was left with number on file.   ?

## 2021-05-08 ENCOUNTER — Ambulatory Visit: Payer: 59 | Admitting: Oncology

## 2021-08-01 ENCOUNTER — Other Ambulatory Visit: Payer: Self-pay | Admitting: *Deleted

## 2021-08-02 ENCOUNTER — Inpatient Hospital Stay: Payer: 59 | Attending: Oncology

## 2021-08-02 ENCOUNTER — Other Ambulatory Visit: Payer: Self-pay | Admitting: *Deleted

## 2021-08-02 ENCOUNTER — Other Ambulatory Visit: Payer: Self-pay

## 2021-08-02 ENCOUNTER — Inpatient Hospital Stay (HOSPITAL_BASED_OUTPATIENT_CLINIC_OR_DEPARTMENT_OTHER): Payer: 59 | Admitting: Oncology

## 2021-08-02 VITALS — BP 114/63 | HR 57 | Temp 98.0°F | Resp 16 | Ht 68.0 in | Wt 137.6 lb

## 2021-08-02 DIAGNOSIS — K769 Liver disease, unspecified: Secondary | ICD-10-CM | POA: Insufficient documentation

## 2021-08-02 DIAGNOSIS — R160 Hepatomegaly, not elsewhere classified: Secondary | ICD-10-CM

## 2021-08-02 DIAGNOSIS — C6292 Malignant neoplasm of left testis, unspecified whether descended or undescended: Secondary | ICD-10-CM | POA: Diagnosis not present

## 2021-08-02 DIAGNOSIS — Z9079 Acquired absence of other genital organ(s): Secondary | ICD-10-CM | POA: Diagnosis not present

## 2021-08-02 DIAGNOSIS — Z79899 Other long term (current) drug therapy: Secondary | ICD-10-CM | POA: Insufficient documentation

## 2021-08-02 DIAGNOSIS — Z8547 Personal history of malignant neoplasm of testis: Secondary | ICD-10-CM | POA: Diagnosis not present

## 2021-08-02 LAB — CMP (CANCER CENTER ONLY)
ALT: 32 U/L (ref 0–44)
AST: 30 U/L (ref 15–41)
Albumin: 4.4 g/dL (ref 3.5–5.0)
Alkaline Phosphatase: 76 U/L (ref 38–126)
Anion gap: 8 (ref 5–15)
BUN: 16 mg/dL (ref 6–20)
CO2: 28 mmol/L (ref 22–32)
Calcium: 9.6 mg/dL (ref 8.9–10.3)
Chloride: 105 mmol/L (ref 98–111)
Creatinine: 1.05 mg/dL (ref 0.61–1.24)
GFR, Estimated: >60 mL/min (ref >60)
Glucose, Bld: 67 mg/dL — ABNORMAL LOW (ref 70–99)
Potassium: 4.4 mmol/L (ref 3.5–5.1)
Sodium: 141 mmol/L (ref 135–145)
Total Bilirubin: 0.5 mg/dL (ref 0.3–1.2)
Total Protein: 7.8 g/dL (ref 6.5–8.1)

## 2021-08-02 LAB — CBC WITH DIFFERENTIAL (CANCER CENTER ONLY)
Abs Immature Granulocytes: 0.01 10*3/uL (ref 0.00–0.07)
Basophils Absolute: 0.1 10*3/uL (ref 0.0–0.1)
Basophils Relative: 2 %
Eosinophils Absolute: 0.9 10*3/uL — ABNORMAL HIGH (ref 0.0–0.5)
Eosinophils Relative: 12 %
HCT: 43.9 % (ref 39.0–52.0)
Hemoglobin: 15.2 g/dL (ref 13.0–17.0)
Immature Granulocytes: 0 %
Lymphocytes Relative: 26 %
Lymphs Abs: 1.8 10*3/uL (ref 0.7–4.0)
MCH: 32.1 pg (ref 26.0–34.0)
MCHC: 34.6 g/dL (ref 30.0–36.0)
MCV: 92.8 fL (ref 80.0–100.0)
Monocytes Absolute: 0.5 10*3/uL (ref 0.1–1.0)
Monocytes Relative: 7 %
Neutro Abs: 3.7 10*3/uL (ref 1.7–7.7)
Neutrophils Relative %: 53 %
Platelet Count: 273 10*3/uL (ref 150–400)
RBC: 4.73 MIL/uL (ref 4.22–5.81)
RDW: 12.9 % (ref 11.5–15.5)
WBC Count: 7 10*3/uL (ref 4.0–10.5)
nRBC: 0 % (ref 0.0–0.2)

## 2021-08-02 NOTE — Progress Notes (Signed)
Hematology and Oncology Follow Up Visit  Kelly Garza [id number] 1990-10-03 31 y.o. 08/02/2021 2:14 PM Patient, No Pcp PerNo ref. 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 He presented with with 65% embryonal component and 25% teratoma. He had LV I an elevated tumor marker of alpha-fetoprotein is 2038 and beta-hCG of 942. Imaging studies which showed pulmonary nodules suspicious for stage IIIa disease.   Prior Therapy:  He is status post orchiectomy done on 04/19/2014.  BEP systemic chemotherapy started on 08/30/2014. He is S/P 3 cycles of BEP completed on 10/26/2014 with complete normalization of his tumor markers.  Current therapy: Active surveillance.  Interim History: Kelly Garza is here for repeat evaluation.  Since last visit, he completed the imaging studies of the liver which showed subtle changes that are not necessarily suspicious for malignancy.  Clinically he reports  Medications: Updated on review. Current Outpatient Medications  Medication Sig Dispense Refill   albuterol (PROVENTIL HFA;VENTOLIN HFA) 108 (90 BASE) MCG/ACT inhaler Inhale into the lungs every 6 (six) hours as needed for wheezing or shortness of breath.     ibuprofen (ADVIL,MOTRIN) 200 MG tablet Take 1 tablet (200 mg total) by mouth every 6 (six) hours as needed. 30 tablet 1   nicotine (NICODERM CQ - DOSED IN MG/24 HOURS) 14 mg/24hr patch Place 1 patch (14 mg total) onto the skin daily. 28 patch 0   testosterone enanthate (DELATESTRYL) 200 MG/ML injection Inject 200 mg into the muscle every 14 (fourteen) days. For IM use only     No current facility-administered medications for this visit.     Allergies: No Known Allergies     Physical Exam:  ECOG: 0   General appearance: Comfortable appearing without any discomfort Head: Normocephalic without any trauma Oropharynx: Mucous membranes are moist and pink without any thrush or  ulcers. Eyes: Pupils are equal and round reactive to light. Lymph nodes: No cervical, supraclavicular, inguinal or axillary lymphadenopathy.   Heart:regular rate and rhythm.  S1 and S2 without leg edema. Lung: Clear without any rhonchi or wheezes.  No dullness to percussion. Abdomin: Soft, nontender, nondistended with good bowel sounds.  No hepatosplenomegaly. Musculoskeletal: No joint deformity or effusion.  Full range of motion noted. Neurological: No deficits noted on motor, sensory and deep tendon reflex exam. Skin: No petechial rash or dryness.  Appeared moist.  Psychiatric: Mood and affect appeared appropriate.  Lab Results: Lab Results  Component Value Date   WBC 5.5 03/23/2015   HGB 13.5 03/23/2015   HCT 40.4 03/23/2015   MCV 89.3 03/23/2015   PLT 276 03/23/2015     Chemistry      Component Value Date/Time   NA 142 03/23/2015 1304   K 4.2 03/23/2015 1304   CO2 26 03/23/2015 1304   BUN 12.0 03/23/2015 1304   CREATININE 0.9 03/23/2015 1304      Component Value Date/Time   CALCIUM 9.1 03/23/2015 1304   ALKPHOS 81 03/23/2015 1304   AST 28 03/23/2015 1304   ALT 36 03/23/2015 1304   BILITOT 0.38 03/23/2015 1304      Study Result  Narrative & Impression  CLINICAL DATA:  History of testicular cancer.  Liver lesion   EXAM: MRI ABDOMEN WITHOUT AND WITH CONTRAST   TECHNIQUE: Multiplanar multisequence MR imaging of the abdomen was performed both before and after the administration of intravenous contrast.   CONTRAST:  23m GADAVIST GADOBUTROL 1 MMOL/ML IV SOLN   COMPARISON:  CT  abdomen and pelvis 03/23/2015   FINDINGS: Lower chest: No acute findings.   Hepatobiliary: Liver is normal in size and contour. There are least 5 mildly hyperintense T2/DWI signal ill-defined foci scattered throughout the liver, best appreciated on the DWI, which measure up to 15 mm subcapsular anteriorly in segment 4. No definite corresponding postcontrast enhancement identified, there  may be subtle hypoenhancement in these regions. No evidence of hepatic steatosis. Gallbladder appears normal. No biliary ductal dilatation.   Pancreas: No mass, inflammatory changes, or other parenchymal abnormality identified.   Spleen:  Within normal limits in size and appearance.   Adrenals/Urinary Tract: Absent left kidney. Right kidney appears normal. No adrenal mass identified.   Stomach/Bowel: No evidence of bowel obstruction.   Vascular/Lymphatic: No pathologically enlarged lymph nodes identified. No abdominal aortic aneurysm demonstrated.   Other:  No ascites.   Musculoskeletal: No suspicious bony lesions.   IMPRESSION: Several subtle hyperintense T2/DWI signal foci scattered in the liver as described. These foci are indeterminate and early metastases can not be excluded, a follow-up MRI with contrast in 3 months is recommended.     Impression and Plan:   31 year old man with:  1.  Right testicular nonseminoma diagnosed in 2016.  He presented with  T2 NX tumor with pathology showed a mixed germ cell tumor is a 65% embryonal component and 25% teratoma. His tumor markers were elevated with alpha-fetoprotein of 2038 and a beta-hCG of 942. Alpha-fetoprotein normalized postoperatively and his beta-hCG after a nadir of 18 was climbing up to 412.  He remains on active surveillance without any evidence of relapsed disease.  Imaging studies did show hepatic lesions that are not consistent with metastatic progression.  Tumor markers are all within normal range obtained in 2023.    2.  Hepatic lesions: MRI obtained on April 20, 2021 showed a subtle lesion that are all not typical for metastatic testicular cancer.  These are difficult to biopsy and have recommended monitoring.  We will arrange for MRI in the near future to determine any progression.  If they remain stable no further imaging will be required.  3. Follow-up: Will be determined pending the results of upcoming  MRI.  30  minutes were spent on this encounter.  The time was dedicated to reviewing his disease status, treatment choices, reviewing imaging studies and future plan of care discussion.      Zola Button, MD 6/21/20232:14 PM

## 2021-08-12 ENCOUNTER — Ambulatory Visit (HOSPITAL_COMMUNITY): Admission: RE | Admit: 2021-08-12 | Payer: 59 | Source: Ambulatory Visit

## 2021-08-31 ENCOUNTER — Ambulatory Visit (HOSPITAL_COMMUNITY): Admission: RE | Admit: 2021-08-31 | Payer: 59 | Source: Ambulatory Visit

## 2021-09-05 ENCOUNTER — Other Ambulatory Visit: Payer: Self-pay | Admitting: *Deleted

## 2021-09-05 DIAGNOSIS — C6290 Malignant neoplasm of unspecified testis, unspecified whether descended or undescended: Secondary | ICD-10-CM

## 2021-09-05 MED ORDER — TESTOSTERONE ENANTHATE 200 MG/ML IM SOLN: 200.0000 mg | INTRAMUSCULAR | 0 refills | Status: DC

## 2021-09-05 MED ORDER — TESTOSTERONE ENANTHATE 200 MG/ML IM SOLN: 200.0000 mg | INTRAMUSCULAR | 0 refills | Status: AC

## 2021-11-09 ENCOUNTER — Ambulatory Visit (HOSPITAL_COMMUNITY)
Admission: RE | Admit: 2021-11-09 | Discharge: 2021-11-09 | Disposition: A | Discharge status: Home / Self Care | Payer: 59 | Source: Ambulatory Visit | Attending: Oncology | Admitting: Oncology

## 2021-11-09 DIAGNOSIS — R160 Hepatomegaly, not elsewhere classified: Secondary | ICD-10-CM | POA: Insufficient documentation

## 2021-11-09 DIAGNOSIS — C6292 Malignant neoplasm of left testis, unspecified whether descended or undescended: Secondary | ICD-10-CM | POA: Insufficient documentation

## 2021-11-09 MED ORDER — GADOPICLENOL 0.5 MMOL/ML IV SOLN
6.0000 mL | Freq: Once | INTRAVENOUS | Status: AC | PRN
  Administered 2021-11-09: 6 mL via INTRAVENOUS

## 2021-11-10 ENCOUNTER — Telehealth: Payer: Self-pay | Admitting: *Deleted

## 2021-11-10 NOTE — Telephone Encounter (Signed)
LM with note below 

## 2021-11-10 NOTE — Telephone Encounter (Signed)
-----   Message from Wyatt Portela, MD sent at 11/10/2021  1:14 PM EDT ----- Please let him know his MRI is normal. No cancer seen and no follow up is needed.

## 2022-04-18 ENCOUNTER — Telehealth: Payer: Self-pay

## 2022-04-18 NOTE — Telephone Encounter (Signed)
Mychart msg sent

## 2022-04-25 ENCOUNTER — Ambulatory Visit: Payer: Medicaid Other | Admitting: Family Medicine

## 2022-04-26 ENCOUNTER — Telehealth: Payer: Self-pay | Admitting: Oncology

## 2022-04-26 NOTE — Telephone Encounter (Signed)
Returned patient's call regarding scheduling message. Left a voicemail.

## 2022-04-27 ENCOUNTER — Telehealth: Payer: Self-pay | Admitting: Oncology

## 2022-04-27 NOTE — Telephone Encounter (Signed)
Patient scheduled with Dr. Alvy Bimler due to Dr, Hazeline Junker departure. Patient is notified of upcoming appointments.

## 2022-05-23 ENCOUNTER — Other Ambulatory Visit: Payer: Self-pay | Admitting: Hematology and Oncology

## 2022-05-23 ENCOUNTER — Other Ambulatory Visit: Payer: Self-pay

## 2022-05-23 DIAGNOSIS — C6292 Malignant neoplasm of left testis, unspecified whether descended or undescended: Secondary | ICD-10-CM

## 2022-05-23 DIAGNOSIS — R160 Hepatomegaly, not elsewhere classified: Secondary | ICD-10-CM

## 2022-05-25 ENCOUNTER — Inpatient Hospital Stay: Payer: Medicaid Other

## 2022-05-25 ENCOUNTER — Inpatient Hospital Stay: Payer: Medicaid Other | Attending: Hematology and Oncology | Admitting: Hematology and Oncology

## 2022-06-05 ENCOUNTER — Encounter: Payer: Self-pay | Admitting: *Deleted

## 2022-07-30 ENCOUNTER — Telehealth: Payer: Self-pay | Admitting: Hematology and Oncology

## 2022-07-30 NOTE — Telephone Encounter (Signed)
Returning call per voicemail, left a message regading scheduling and also call back so patient could get in contact.

## 2022-08-01 ENCOUNTER — Ambulatory Visit: Payer: Medicaid Other | Admitting: Family Medicine

## 2022-08-07 ENCOUNTER — Ambulatory Visit: Payer: Medicaid Other | Admitting: Family Medicine

## 2022-08-08 ENCOUNTER — Encounter: Payer: Self-pay | Admitting: General Practice

## 2022-08-08 ENCOUNTER — Telehealth: Payer: Self-pay | Admitting: Hematology and Oncology

## 2022-08-08 NOTE — Telephone Encounter (Signed)
Rescheduled missed April appointment per patient's request.

## 2022-08-14 ENCOUNTER — Encounter: Payer: Self-pay | Admitting: Hematology and Oncology

## 2022-08-14 ENCOUNTER — Other Ambulatory Visit: Payer: Self-pay

## 2022-08-14 ENCOUNTER — Inpatient Hospital Stay (HOSPITAL_BASED_OUTPATIENT_CLINIC_OR_DEPARTMENT_OTHER): Payer: Medicaid Other | Admitting: Hematology and Oncology

## 2022-08-14 ENCOUNTER — Inpatient Hospital Stay: Payer: Medicaid Other | Attending: Hematology and Oncology

## 2022-08-14 VITALS — BP 126/77 | HR 64 | Temp 98.4°F | Resp 18 | Ht 68.0 in | Wt 133.8 lb

## 2022-08-14 DIAGNOSIS — C6292 Malignant neoplasm of left testis, unspecified whether descended or undescended: Secondary | ICD-10-CM | POA: Diagnosis not present

## 2022-08-14 DIAGNOSIS — K769 Liver disease, unspecified: Secondary | ICD-10-CM | POA: Diagnosis not present

## 2022-08-14 DIAGNOSIS — C6291 Malignant neoplasm of right testis, unspecified whether descended or undescended: Secondary | ICD-10-CM | POA: Insufficient documentation

## 2022-08-14 DIAGNOSIS — R160 Hepatomegaly, not elsewhere classified: Secondary | ICD-10-CM

## 2022-08-14 DIAGNOSIS — Q600 Renal agenesis, unilateral: Secondary | ICD-10-CM

## 2022-08-14 LAB — CBC WITH DIFFERENTIAL (CANCER CENTER ONLY)
Abs Immature Granulocytes: 0.01 10*3/uL (ref 0.00–0.07)
Basophils Absolute: 0.1 10*3/uL (ref 0.0–0.1)
Basophils Relative: 2 %
Eosinophils Absolute: 0.6 10*3/uL — ABNORMAL HIGH (ref 0.0–0.5)
Eosinophils Relative: 8 %
HCT: 47.3 % (ref 39.0–52.0)
Hemoglobin: 16.3 g/dL (ref 13.0–17.0)
Immature Granulocytes: 0 %
Lymphocytes Relative: 24 %
Lymphs Abs: 1.7 10*3/uL (ref 0.7–4.0)
MCH: 32.5 pg (ref 26.0–34.0)
MCHC: 34.5 g/dL (ref 30.0–36.0)
MCV: 94.2 fL (ref 80.0–100.0)
Monocytes Absolute: 0.5 10*3/uL (ref 0.1–1.0)
Monocytes Relative: 7 %
Neutro Abs: 4.1 10*3/uL (ref 1.7–7.7)
Neutrophils Relative %: 59 %
Platelet Count: 287 10*3/uL (ref 150–400)
RBC: 5.02 MIL/uL (ref 4.22–5.81)
RDW: 12.4 % (ref 11.5–15.5)
WBC Count: 6.9 10*3/uL (ref 4.0–10.5)
nRBC: 0 % (ref 0.0–0.2)

## 2022-08-14 LAB — CMP (CANCER CENTER ONLY)
ALT: 18 U/L (ref 0–44)
AST: 17 U/L (ref 15–41)
Albumin: 4.4 g/dL (ref 3.5–5.0)
Alkaline Phosphatase: 66 U/L (ref 38–126)
Anion gap: 5 (ref 5–15)
BUN: 10 mg/dL (ref 6–20)
CO2: 27 mmol/L (ref 22–32)
Calcium: 9.1 mg/dL (ref 8.9–10.3)
Chloride: 108 mmol/L (ref 98–111)
Creatinine: 0.94 mg/dL (ref 0.61–1.24)
GFR, Estimated: >60 mL/min (ref >60)
Glucose, Bld: 94 mg/dL (ref 70–99)
Potassium: 4.6 mmol/L (ref 3.5–5.1)
Sodium: 140 mmol/L (ref 135–145)
Total Bilirubin: 0.3 mg/dL (ref 0.3–1.2)
Total Protein: 7 g/dL (ref 6.5–8.1)

## 2022-08-14 LAB — LACTATE DEHYDROGENASE: LDH: 132 U/L (ref 98–192)

## 2022-08-15 ENCOUNTER — Encounter: Payer: Self-pay | Admitting: Hematology and Oncology

## 2022-08-15 DIAGNOSIS — K769 Liver disease, unspecified: Secondary | ICD-10-CM | POA: Insufficient documentation

## 2022-08-15 DIAGNOSIS — Q600 Renal agenesis, unilateral: Secondary | ICD-10-CM | POA: Insufficient documentation

## 2022-08-15 LAB — BETA HCG QUANT (REF LAB): hCG Quant: 2 m[IU]/mL (ref 0–3)

## 2022-08-15 NOTE — Progress Notes (Signed)
[name] FOLLOW-UP progress notes  Patient Care Team: Patient, No Pcp Per as PCP - General (General Practice)  CHIEF COMPLAINTS/PURPOSE OF VISIT:  History of testicular cancer, seminoma, for further management  HISTORY OF PRESENTING ILLNESS:  Kelly Garza 32 y.o. male was transferred to my care after his prior physician has left.  The patient had history of congenital diaphragmatic hernia status postrepair.  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  He subsequently completed chemotherapy and is being observed He denies abdominal pain He is doing well  I reviewed the patient's records extensive and collaborated the history with the patient. Summary of his history is as follows: Oncology History  Testis cancer (HCC)  04/18/2014 Imaging   US scrotum Large heterogeneous right testicular mass measuring 8.5 x 6.7 x 5.3 cm. Right testicular parenchyma and epididymis are not identified. 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   Left testicle is surgically absent.    04/18/2014 Imaging   Ct scan 1. Large heterogeneous right testicular mass measures approximately 8.5 x 6.1 cm.  2. Large cyst or bulla in the left lower lobe may be congenital or sequelae of prior surgery given history of diaphragmatic hernia repair. There is an associated nodular opacity along inferior aspect measuring 1.1 x 0.8 cm, which may represent nodular scarring or perhaps relate to sequelae of bronchial atresia. A metastasis is felt to be less likely, though imaging of the chest to evaluate the lungs in their entirety is reasonable.  3. Mildly prominent retrocrural lymph nodes are nonspecific and may be reactive. A PET-CT may be able to further characterize.  4. 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  5. Surgically absent left testicle.    04/19/2014 Surgery   Date  of Procedure: 04/19/2014  Attending: Timothy Lasso, MD  Resident: Vonda Antigua, MD  Preop Dx: There is no problem list on file for this patient.  Post op Dx: Same  Operations Performed: Right Radical Orchiectomy  Anesthesia: General  Specimens: Right Testicle and spermatic cord  Drains: none  Complications: none  Estimated Blood Loss: minimal  Indications: Kelly Garza is a 32 y.o. male with a history of a large right mass found on ultrasound to be concerning for a testicular malignancy. This is currently his only testicle as his left testicle was previously removed. He has a history of diaphragmatic hernia, solitary right kidney and adrenal gland, and cleft palate. After discussing with him management options, we have gone over the risks and consented Kelly Garza and he has elected to proceed with a right radical orchiectomy through an inguinal incision.    06/03/2014 Imaging   Ct chest 1. Two  4 mm nodules as detailed above, are nonspecific but worrisome for metastatic disease.  2. Large bullae encompassing a large portion of the left lower lobe is favored to be post-surgical from prior congenital diaphragmatic hernia repair.  In addition, there is a nodular focus along the inferior margin of the bullae, which may also be postsurgical in etiology; however, recommend a PET CT to rule out metastasis given the patient's known testicular neoplasm.  3. Basal predominant subpleural cystic changes (left greater than right) of unclear etiology, but may be sequela of known congenital diaphragmatic hernia or early manifestation of alpha-1-antitrypsin disease.  In addition, there is central bronchial wall thickening.    08/17/2014 Initial  Diagnosis   Testis cancer (HCC)   08/24/2014 Tumor Marker   Patient's tumor was tested for the following markers: HCG. Results of the tumor marker test revealed 412.   08/25/2014 Imaging   1. Prominent borderline enlarged left inguinal  and left common femoral lymph nodes, as well as mildly enlarged retrocrural lymph nodes, and a borderline enlarged right hilar lymph node. These findings are nonspecific, but metastatic disease is not excluded. Further evaluation with PET-CT is suggested at this time. 2. In addition, there is a 1.2 x 0.6 cm macrolobulated pulmonary nodule in the inferior aspect of the left lower lobe. This is highly nonspecific, and is favored to represent an area of chronically scarred lung parenchyma adjacent to the large bullae in the base of the left lower lobe. However, attention at time of follow-up PET-CT is suggested to exclude hypermetabolism, as an isolated metastatic lesion is not excluded. Other tiny pulmonary nodules in lungs bilaterally are favored to represent subpleural lymph nodes but warrant continued attention on future follow-up imaging. 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 4. Unusual appearance of the left lung, with generalized mild paraseptal emphysema, which is far more pronounced at the left lung base where there are several large bullae. The findings at the left lung base could be explained by the presence of bronchial atresia leading up to the affected segment of lung, however, this would not account for the mild generalized paraseptal emphysema in the left lung. Pulmonology referral could be considered for further evaluation.       08/30/2014 - 10/26/2014 Chemotherapy   He had 3 cycles of BEP   09/20/2014 Tumor Marker   Patient's tumor was tested for the following markers: HCG. Results of the tumor marker test revealed 5.9.   11/09/2014 Imaging   1. Similar to decrease in size of thoracic and pelvic nodes. Not in the typical drainage pattern for testicular primary. This could represent decrease in reactive nodes or an atypical appearance of nodal metastasis, status post response to therapy. 2. No new or progressive disease identified. 3. 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  Absence of colon in the right-sided the abdomen may also be developmental in nature. 4. Centrilobular and paraseptal emphysema with a more focal area of emphysema at the left lung base. This is favored to be due to a remote infectious or inflammatory insult. The area of previous described nodularity is less well-defined today.   03/23/2015 Imaging   Stable exam. No evidence of metastatic disease or other acute findings within the chest, abdomen, or pelvis.   11/16/2016 Imaging   CT ABDOMEN PELVIS W CONTRAST (ROUTINE), 11/16/2016 4:56 PM   INDICATION:testicular cancer \ C62.91 Malignant neoplasm of right testis, unspecified whether descended or undescended [facility])   COMPARISON: CT abdomen and pelvis 04/18/2014.   TECHNIQUE: Multislice axial images were obtained through the abdomen and pelvis with administration of iodinated intravenous contrast material. Multi-planar reformatted images were generated for additional analysis. Nongated technique limits cardiac detail.   All CT scans at [facility] and [facility] are performed using dose optimization techniques as appropriate to a performed exam, including but not limited to one or more of the following: automated exposure control, adjustment of the mA and/or kV according to patient size, use of iterative reconstruction technique. In addition, [facility] is participating in the ACR Dose Registry program which will further assist [place] in optimizing patient radiation exposure.   FINDINGS:   LOWER CHEST:  .  Heart/vessel: Normal heart size.  No pericardial effusion.  .  Lungs: Large cyst/emphysematous bulla demonstrates interval reduction in size now measuring 9.9 x 7.7 cm. Linear atelectatic changes in the left lower lobe. Minimal mosaic attenuation in the right lower lobe.  .  Pleura: No pleural effusions.   ABDOMEN:  . Liver: Hepatomegaly with the liver measuring 20 cm in craniocaudal dimension.  .  Gallbladder/biliary:  Gallbladder is decompressed. No signs of cholelithiasis.  Marland Kitchen  Spleen: Within normal limits. Accessory splenule.  .  Pancreas: Within normal limits.  .  Adrenals: Right adrenal gland is unremarkable. Left adrenal gland is not visible.  .  Kidneys: Absent left kidney. Right kidney is unremarkable. No hydronephrosis.  .  Peritoneum: Within normal limits.  .  Mesentery: Similar nonspecific prominent central mesenteric nodes, largest measuring 9.7 mm.  .  Extraperitoneum: No enlarged retroperitoneal lymph nodes can be identified.  .  GI tract: No signs of obstruction. Moderate stool burden in the colon, may suggest constipation in the appropriate clinical setting.  .  Vascular: Within normal limits.   PELVIS:  . Ureters: Within normal limits.  .  Bladder: Within normal limits.  .  Reproductive system: Status post radical right orchiectomy. Remote left orchiectomy.   MSK:  . Mild degenerative changes in the spine. Tiny sclerotic focus in the left superior acetabular rim similar to prior, likely represents a bone island.   CONCLUSION:   1. Status post right orchiectomy for right testicular cancer. No evidence of metastatic disease or regional adenopathy.  2.  Numerous additional findings as mentioned in the body of the report.    02/07/2021 Imaging   1. Interval development of multiple ill-defined hypoenhancing lesions involving both lobes of the liver. Although precise characterization is limited by CT, the interval change is concerning for metastatic disease. Advise further evaluation with hepatic MRI.  2. Increase in size of a small sclerotic lesion in the right aspect of S1, raising the possibility of osseous metastatic lesion.  3. Interval increase in size of a subcentimeter interaortocaval lymph node.    04/20/2021 Imaging   Several subtle hyperintense T2/DWI signal foci scattered in the liver as described. These foci are indeterminate and early metastases can not be excluded, a follow-up  MRI with contrast in 3 months is recommended.   11/09/2021 Imaging   1. No acute findings identified within the abdomen. No highly specific findings identified to suggest metastatic disease to the abdomen. 2. Previously noted subtle areas of increased T2/DWI signal have resolved in the interval since the previous MRI from 04/20/2021. These did not have the typical appearance of liver metastasis. In the absence of interval therapy since 04/20/2021 these are presumed to have represented a benign process. 3. Similar to the previous MRI there are three new subtle areas of increased T2/DWI signal without signs of enhancement. As before these are indeterminate, but favored to represent a benign process. Consider follow-up imaging with repeat MRI in 3-6 months to ensure resolution   08/15/2022 Tumor Marker   Patient's tumor was tested for the following markers: HCG. Results of the tumor marker test revealed 2.     MEDICAL HISTORY:  Past Medical History:  Diagnosis Date   Testis cancer [facility])     SURGICAL HISTORY: History reviewed. No pertinent surgical history.  SOCIAL HISTORY: Social History   Socioeconomic History   Marital status: Married    Spouse name: Marchelle Folks   Number of children: 3   Years of education: Not on file   Highest education level:  Not on file  Occupational History   Occupation: Chartered certified accountant  Tobacco Use   Smoking status: Never   Smokeless tobacco: Not on file  Substance and Sexual Activity   Alcohol use: Not Currently   Drug use: Not on file   Sexual activity: Not on file  Other Topics Concern   Not on file  Social History Narrative   Not on file   Social Determinants of Health   Financial Resource Strain: Not on file  Food Insecurity: Not on file  Transportation Needs: Not on file  Physical Activity: Not on file  Stress: Not on file  Social Connections: Not on file  Intimate Partner Violence: Not on file    FAMILY HISTORY: History reviewed. No pertinent  family history.  ALLERGIES:  has No Known Allergies.  MEDICATIONS:  Current Outpatient Medications  Medication Sig Dispense Refill   albuterol (PROVENTIL HFA;VENTOLIN HFA) 108 (90 BASE) MCG/ACT inhaler Inhale into the lungs every 6 (six) hours as needed for wheezing or shortness of breath.     testosterone enanthate (DELATESTRYL) 200 MG/ML injection Inject 1 mL (200 mg total) into the muscle every 14 (fourteen) days. For IM use only 5 mL 0   No current facility-administered medications for this visit.    REVIEW OF SYSTEMS:   Constitutional: Denies fevers, chills or abnormal night sweats Eyes: Denies blurriness of vision, double vision or watery eyes Ears, nose, mouth, throat, and face: Denies mucositis or sore throat Respiratory: Denies cough, dyspnea or wheezes Cardiovascular: Denies palpitation, chest discomfort or lower extremity swelling Gastrointestinal:  Denies nausea, heartburn or change in bowel habits Skin: Denies abnormal skin rashes Lymphatics: Denies new lymphadenopathy or easy bruising Neurological:Denies numbness, tingling or new weaknesses Behavioral/Psych: Mood is stable, no new changes  All other systems were reviewed with the patient and are negative.  PHYSICAL EXAMINATION: ECOG PERFORMANCE STATUS: 0 - Asymptomatic  Vitals:   08/14/22 0944  BP: 126/77  Pulse: 64  Resp: 18  Temp: 98.4 F (36.9 C)  SpO2: 100%   Filed Weights   08/14/22 0944  Weight: 133 lb 12.8 oz (60.7 kg)    GENERAL:alert, no distress and comfortable SKIN: skin color, texture, turgor are normal, no rashes or significant lesions EYES: normal, conjunctiva are pink and non-injected, sclera clear OROPHARYNX:no exudate, normal lips, buccal mucosa, and tongue  NECK: supple, thyroid normal size, non-tender, without nodularity LYMPH:  no palpable lymphadenopathy in the cervical, axillary or inguinal LUNGS: clear to auscultation and percussion with normal breathing effort HEART: regular rate  & rhythm and no murmurs without lower extremity edema ABDOMEN:abdomen soft, non-tender and normal bowel sounds Musculoskeletal:no cyanosis of digits and no clubbing  PSYCH: alert & oriented x 3 with fluent speech NEURO: no focal motor/sensory deficits  LABORATORY DATA:  I have reviewed the data as listed Lab Results  Component Value Date   WBC 6.9 08/14/2022   HGB 16.3 08/14/2022   HCT 47.3 08/14/2022   MCV 94.2 08/14/2022   PLT 287 08/14/2022   Recent Labs    08/14/22 0929  NA 140  K 4.6  CL 108  CO2 27  GLUCOSE 94  BUN 10  CREATININE 0.94  CALCIUM 9.1  GFRNONAA >60  PROT 7.0  ALBUMIN 4.4  AST 17  ALT 18  ALKPHOS 66  BILITOT 0.3    ASSESSMENT & PLAN:  Testis cancer Clinically, he has no signs of cancer recurrence Due to history of abnormal MRI imaging, I recommend repeat imaging study with MRI over the  next 2 weeks and I will call him with test results The patient was last treated with chemotherapy in 2016 He is almost 8 years out from disease If the next imaging study is normal, he does not need long-term follow-up  Liver lesion We will follow-up with MRI as above I will call him with test results  Orders Placed This Encounter  Procedures   MR LIVER W WO CONTRAST    Standing Status:   Future    Standing Expiration Date:   08/14/2023    Order Specific Question:   If indicated for the ordered procedure, I authorize the administration of contrast media per Radiology protocol    Answer:   Yes    Order Specific Question:   What is the patient's sedation requirement?    Answer:   No Sedation    Order Specific Question:   Does the patient have a pacemaker or implanted devices?    Answer:   No    Order Specific Question:   Preferred imaging location?    Answer:   [facility] (table limit - 550 lbs)    All questions were answered. The patient knows to call the clinic with any problems, questions or concerns. The total time spent in the appointment was  30 minutes encounter with patients including review of chart and various tests results, discussions about plan of care and coordination of care plan   Artis Delay, MD 08/15/2022 10:29 AM

## 2022-08-15 NOTE — Assessment & Plan Note (Signed)
We will follow-up with MRI as above I will call him with test results

## 2022-08-15 NOTE — Assessment & Plan Note (Signed)
Clinically, he has no signs of cancer recurrence Due to history of abnormal MRI imaging, I recommend repeat imaging study with MRI over the next 2 weeks and I will call him with test results The patient was last treated with chemotherapy in 2016 He is almost 8 years out from disease If the next imaging study is normal, he does not need long-term follow-up

## 2022-08-22 LAB — AFP TUMOR MARKER

## 2022-08-29 ENCOUNTER — Ambulatory Visit (HOSPITAL_COMMUNITY): Admission: RE | Admit: 2022-08-29 | Payer: Medicaid Other | Source: Ambulatory Visit

## 2022-08-31 ENCOUNTER — Telehealth: Payer: Self-pay

## 2022-08-31 NOTE — Telephone Encounter (Signed)
Called and spoke with wife. She will reschedule MRI today, Shuaib missed appt due a bee sting. She will call the office back once it is scheduled.

## 2022-08-31 NOTE — Telephone Encounter (Signed)
-----   Message from Artis Delay sent at 08/31/2022 10:41 AM EDT ----- Patient no show to his MRI Can you call and reschedule?

## 2022-09-14 ENCOUNTER — Ambulatory Visit (HOSPITAL_COMMUNITY): Admission: RE | Admit: 2022-09-14 | Payer: Medicaid Other | Source: Ambulatory Visit

## 2022-09-21 ENCOUNTER — Ambulatory Visit (HOSPITAL_COMMUNITY): Payer: Medicaid Other

## 2022-10-18 ENCOUNTER — Telehealth: Payer: Self-pay

## 2022-10-18 DIAGNOSIS — F319 Bipolar disorder, unspecified: Secondary | ICD-10-CM | POA: Diagnosis not present

## 2022-10-18 DIAGNOSIS — J45909 Unspecified asthma, uncomplicated: Secondary | ICD-10-CM | POA: Diagnosis not present

## 2022-10-18 DIAGNOSIS — C6290 Malignant neoplasm of unspecified testis, unspecified whether descended or undescended: Secondary | ICD-10-CM | POA: Diagnosis not present

## 2022-10-18 DIAGNOSIS — E291 Testicular hypofunction: Secondary | ICD-10-CM | POA: Diagnosis not present

## 2022-10-18 DIAGNOSIS — R12 Heartburn: Secondary | ICD-10-CM | POA: Diagnosis not present

## 2022-10-18 NOTE — Telephone Encounter (Signed)
No, I do not prescribe testosterone inj

## 2022-10-18 NOTE — Telephone Encounter (Signed)
Returned call to [employer]. She is requesting radiology scheduling # to reschedule CT. Given radiology scheduling #. Audry is asking if Dr. Bertis Ruddy will refill testosterone injection to the drug store? He has been out 3 weeks and is starting to feel depressed.  He finally got a PCP but the appt is not until the end of October.

## 2022-10-18 NOTE — Telephone Encounter (Signed)
Called mother back and given below message. She became mad that Dr. Bertis Ruddy would not refill Rx and hung up the call.

## 2022-10-19 DIAGNOSIS — E291 Testicular hypofunction: Secondary | ICD-10-CM | POA: Diagnosis not present

## 2022-11-19 DIAGNOSIS — E291 Testicular hypofunction: Secondary | ICD-10-CM | POA: Diagnosis not present

## 2022-11-20 DIAGNOSIS — C6290 Malignant neoplasm of unspecified testis, unspecified whether descended or undescended: Secondary | ICD-10-CM | POA: Diagnosis not present

## 2022-11-20 DIAGNOSIS — R12 Heartburn: Secondary | ICD-10-CM | POA: Diagnosis not present

## 2022-11-20 DIAGNOSIS — E291 Testicular hypofunction: Secondary | ICD-10-CM | POA: Diagnosis not present

## 2022-11-20 DIAGNOSIS — J45909 Unspecified asthma, uncomplicated: Secondary | ICD-10-CM | POA: Diagnosis not present

## 2022-11-20 DIAGNOSIS — F319 Bipolar disorder, unspecified: Secondary | ICD-10-CM | POA: Diagnosis not present

## 2022-12-05 DIAGNOSIS — C6290 Malignant neoplasm of unspecified testis, unspecified whether descended or undescended: Secondary | ICD-10-CM | POA: Diagnosis not present

## 2022-12-05 DIAGNOSIS — F319 Bipolar disorder, unspecified: Secondary | ICD-10-CM | POA: Diagnosis not present

## 2022-12-05 DIAGNOSIS — E291 Testicular hypofunction: Secondary | ICD-10-CM | POA: Diagnosis not present

## 2022-12-05 DIAGNOSIS — R12 Heartburn: Secondary | ICD-10-CM | POA: Diagnosis not present

## 2022-12-05 DIAGNOSIS — J45909 Unspecified asthma, uncomplicated: Secondary | ICD-10-CM | POA: Diagnosis not present

## 2023-05-20 DIAGNOSIS — J45909 Unspecified asthma, uncomplicated: Secondary | ICD-10-CM | POA: Diagnosis not present

## 2023-09-30 DIAGNOSIS — F319 Bipolar disorder, unspecified: Secondary | ICD-10-CM | POA: Diagnosis not present

## 2023-09-30 DIAGNOSIS — J45909 Unspecified asthma, uncomplicated: Secondary | ICD-10-CM | POA: Diagnosis not present

## 2023-09-30 DIAGNOSIS — R12 Heartburn: Secondary | ICD-10-CM | POA: Diagnosis not present

## 2023-09-30 DIAGNOSIS — E291 Testicular hypofunction: Secondary | ICD-10-CM | POA: Diagnosis not present

## 2023-10-03 DIAGNOSIS — E291 Testicular hypofunction: Secondary | ICD-10-CM | POA: Diagnosis not present

## 2024-03-06 ENCOUNTER — Other Ambulatory Visit (HOSPITAL_COMMUNITY): Payer: Self-pay | Admitting: Internal Medicine

## 2024-03-06 DIAGNOSIS — R519 Headache, unspecified: Secondary | ICD-10-CM
